# Patient Record
Sex: Female | Born: 1965 | Race: White | Hispanic: No | Marital: Single
Health system: Midwestern US, Academic
[De-identification: ages and names within clinical notes are randomized; demographics above are authoritative.]

---

## 2017-07-05 ENCOUNTER — Encounter: Admit: 2017-07-05 | Discharge: 2017-07-05 | Payer: 59

## 2017-07-05 DIAGNOSIS — R0789 Other chest pain: Principal | ICD-10-CM

## 2017-07-24 ENCOUNTER — Encounter: Admit: 2017-07-24 | Discharge: 2017-07-24 | Payer: 59

## 2018-03-19 ENCOUNTER — Encounter: Admit: 2018-03-19 | Discharge: 2018-03-19 | Payer: 59

## 2018-03-19 DIAGNOSIS — R079 Chest pain, unspecified: Principal | ICD-10-CM

## 2018-05-21 ENCOUNTER — Encounter: Admit: 2018-05-21 | Discharge: 2018-05-21 | Payer: 59

## 2018-05-21 ENCOUNTER — Ambulatory Visit: Admit: 2018-05-21 | Discharge: 2018-05-22 | Payer: 59

## 2018-05-21 ENCOUNTER — Ambulatory Visit: Admit: 2018-05-21 | Discharge: 2018-05-21 | Payer: 59

## 2018-05-21 DIAGNOSIS — R079 Chest pain, unspecified: Principal | ICD-10-CM

## 2018-05-21 DIAGNOSIS — E119 Type 2 diabetes mellitus without complications: ICD-10-CM

## 2018-05-21 DIAGNOSIS — E785 Hyperlipidemia, unspecified: ICD-10-CM

## 2018-05-21 DIAGNOSIS — G459 Transient cerebral ischemic attack, unspecified: ICD-10-CM

## 2018-05-21 DIAGNOSIS — R609 Edema, unspecified: ICD-10-CM

## 2018-05-21 DIAGNOSIS — R55 Syncope and collapse: ICD-10-CM

## 2018-05-21 DIAGNOSIS — I10 Essential (primary) hypertension: ICD-10-CM

## 2018-05-21 MED ORDER — REGADENOSON 0.4 MG/5 ML IV SYRG
.4 mg | Freq: Once | INTRAVENOUS | 0 refills | Status: CP
Start: 2018-05-21 — End: ?

## 2018-05-23 ENCOUNTER — Encounter: Admit: 2018-05-23 | Discharge: 2018-05-23 | Payer: 59

## 2018-05-23 DIAGNOSIS — R9439 Abnormal result of other cardiovascular function study: Principal | ICD-10-CM

## 2018-05-29 ENCOUNTER — Encounter: Admit: 2018-05-29 | Discharge: 2018-05-29 | Payer: 59

## 2018-05-29 DIAGNOSIS — R55 Syncope and collapse: ICD-10-CM

## 2018-05-29 DIAGNOSIS — R079 Chest pain, unspecified: Principal | ICD-10-CM

## 2018-05-29 DIAGNOSIS — R609 Edema, unspecified: ICD-10-CM

## 2018-05-29 DIAGNOSIS — E785 Hyperlipidemia, unspecified: ICD-10-CM

## 2018-05-29 DIAGNOSIS — E119 Type 2 diabetes mellitus without complications: ICD-10-CM

## 2018-05-29 DIAGNOSIS — I10 Essential (primary) hypertension: ICD-10-CM

## 2018-05-29 DIAGNOSIS — G459 Transient cerebral ischemic attack, unspecified: ICD-10-CM

## 2018-06-05 ENCOUNTER — Encounter: Admit: 2018-06-05 | Discharge: 2018-06-05 | Payer: 59

## 2018-06-05 ENCOUNTER — Ambulatory Visit: Admit: 2018-06-05 | Discharge: 2018-06-06 | Payer: 59

## 2018-06-05 DIAGNOSIS — I25118 Atherosclerotic heart disease of native coronary artery with other forms of angina pectoris: ICD-10-CM

## 2018-06-05 DIAGNOSIS — R079 Chest pain, unspecified: Principal | ICD-10-CM

## 2018-06-05 DIAGNOSIS — I10 Essential (primary) hypertension: ICD-10-CM

## 2018-06-05 DIAGNOSIS — E785 Hyperlipidemia, unspecified: ICD-10-CM

## 2018-06-05 DIAGNOSIS — R9439 Abnormal result of other cardiovascular function study: ICD-10-CM

## 2018-06-05 DIAGNOSIS — R609 Edema, unspecified: ICD-10-CM

## 2018-06-05 DIAGNOSIS — R55 Syncope and collapse: ICD-10-CM

## 2018-06-05 DIAGNOSIS — E119 Type 2 diabetes mellitus without complications: ICD-10-CM

## 2018-06-05 DIAGNOSIS — I25119 Atherosclerotic heart disease of native coronary artery with unspecified angina pectoris: ICD-10-CM

## 2018-06-05 DIAGNOSIS — G459 Transient cerebral ischemic attack, unspecified: ICD-10-CM

## 2018-06-05 LAB — BASIC METABOLIC PANEL
Lab: 137
Lab: 14
Lab: 15

## 2018-06-05 LAB — CBC
Lab: 13
Lab: 28 — ABNORMAL HIGH (ref 70–105)
Lab: 286
Lab: 32 — ABNORMAL LOW (ref 33.0–37.0)
Lab: 4.5
Lab: 8.1
Lab: 88

## 2018-06-05 MED ORDER — NITROGLYCERIN 0.4 MG SL SUBL
.4 mg | ORAL_TABLET | SUBLINGUAL | 0 refills | 9.00000 days | Status: AC | PRN
Start: 2018-06-05 — End: 2018-09-02

## 2018-06-05 MED ORDER — ROSUVASTATIN 20 MG PO TAB
20 mg | ORAL_TABLET | Freq: Every day | ORAL | 5 refills | 90.00000 days | Status: AC
Start: 2018-06-05 — End: 2019-01-13

## 2018-06-05 MED ORDER — ASPIRIN 81 MG PO TBEC
81 mg | ORAL_TABLET | Freq: Every day | ORAL | 5 refills | Status: AC
Start: 2018-06-05 — End: ?

## 2018-06-06 ENCOUNTER — Encounter: Admit: 2018-06-06 | Discharge: 2018-06-06 | Payer: 59

## 2018-06-06 DIAGNOSIS — R079 Chest pain, unspecified: Principal | ICD-10-CM

## 2018-06-06 DIAGNOSIS — R9439 Abnormal result of other cardiovascular function study: ICD-10-CM

## 2018-06-10 ENCOUNTER — Encounter: Admit: 2018-06-10 | Discharge: 2018-06-10 | Payer: 59

## 2018-06-10 DIAGNOSIS — R9439 Abnormal result of other cardiovascular function study: ICD-10-CM

## 2018-06-10 DIAGNOSIS — I208 Other forms of angina pectoris: Principal | ICD-10-CM

## 2018-06-10 MED ORDER — DOCUSATE SODIUM 100 MG PO CAP
100 mg | Freq: Every day | ORAL | 0 refills | Status: CN | PRN
Start: 2018-06-10 — End: ?

## 2018-06-10 MED ORDER — SODIUM CHLORIDE 0.9 % IV SOLP
1000 mL | INTRAVENOUS | 0 refills | Status: CN
Start: 2018-06-10 — End: ?

## 2018-06-10 MED ORDER — ALUMINUM-MAGNESIUM HYDROXIDE 200-200 MG/5 ML PO SUSP
30 mL | ORAL | 0 refills | Status: CN | PRN
Start: 2018-06-10 — End: ?

## 2018-06-10 MED ORDER — SODIUM CHLORIDE 0.9 % IV SOLP
250 mL | INTRAVENOUS | 0 refills | Status: CN
Start: 2018-06-10 — End: ?

## 2018-06-10 MED ORDER — ACETAMINOPHEN 325 MG PO TAB
650 mg | ORAL | 0 refills | Status: CN | PRN
Start: 2018-06-10 — End: ?

## 2018-06-11 ENCOUNTER — Encounter: Admit: 2018-06-11 | Discharge: 2018-06-11 | Payer: 59

## 2018-06-12 ENCOUNTER — Encounter: Admit: 2018-06-12 | Discharge: 2018-06-12 | Payer: 59

## 2018-06-12 ENCOUNTER — Ambulatory Visit: Admit: 2018-06-12 | Discharge: 2018-06-12 | Payer: 59

## 2018-06-12 ENCOUNTER — Encounter: Admit: 2018-06-12 | Discharge: 2018-06-13 | Payer: 59

## 2018-06-12 DIAGNOSIS — E785 Hyperlipidemia, unspecified: Secondary | ICD-10-CM

## 2018-06-12 DIAGNOSIS — R609 Edema, unspecified: ICD-10-CM

## 2018-06-12 DIAGNOSIS — I25118 Atherosclerotic heart disease of native coronary artery with other forms of angina pectoris: ICD-10-CM

## 2018-06-12 DIAGNOSIS — I10 Essential (primary) hypertension: Secondary | ICD-10-CM

## 2018-06-12 DIAGNOSIS — G459 Transient cerebral ischemic attack, unspecified: ICD-10-CM

## 2018-06-12 DIAGNOSIS — R55 Syncope and collapse: ICD-10-CM

## 2018-06-12 DIAGNOSIS — E119 Type 2 diabetes mellitus without complications: ICD-10-CM

## 2018-06-12 DIAGNOSIS — R079 Chest pain, unspecified: Principal | ICD-10-CM

## 2018-06-12 DIAGNOSIS — E1139 Type 2 diabetes mellitus with other diabetic ophthalmic complication: ICD-10-CM

## 2018-06-12 LAB — POC GLUCOSE
Lab: 182 mg/dL — ABNORMAL HIGH (ref 70–100)
Lab: 127 mg/dL — ABNORMAL HIGH (ref 70–100)

## 2018-06-12 LAB — LIPID PROFILE
Lab: 101 mg/dL
Lab: 164 mg/dL — ABNORMAL HIGH (ref <100)
Lab: 251 mg/dL
Lab: 295 mg/dL — ABNORMAL HIGH (ref <200)
Lab: 44 mg/dL (ref >40)
Lab: 503 mg/dL — ABNORMAL HIGH (ref <150)

## 2018-06-12 MED ORDER — METOPROLOL SUCCINATE 50 MG PO TB24
50 mg | Freq: Every day | ORAL | 0 refills | Status: DC
Start: 2018-06-12 — End: 2018-06-13
  Administered 2018-06-13: 13:00:00 50 mg via ORAL

## 2018-06-12 MED ORDER — ROSUVASTATIN 20 MG PO TAB
20 mg | Freq: Every evening | ORAL | 0 refills | Status: DC
Start: 2018-06-12 — End: 2018-06-13
  Administered 2018-06-13: 03:00:00 20 mg via ORAL

## 2018-06-12 MED ORDER — INSULIN GLARGINE 100 UNIT/ML (3 ML) SC INJ PEN
68 [IU] | Freq: Every day | SUBCUTANEOUS | 0 refills | Status: DC
Start: 2018-06-12 — End: 2018-06-13
  Administered 2018-06-13: 13:00:00 68 [IU] via SUBCUTANEOUS

## 2018-06-12 MED ORDER — FUROSEMIDE 40 MG PO TAB
40 mg | Freq: Every day | ORAL | 0 refills | Status: DC
Start: 2018-06-12 — End: 2018-06-13
  Administered 2018-06-13: 13:00:00 40 mg via ORAL

## 2018-06-12 MED ORDER — INSULIN GLARGINE 100 UNIT/ML SC SOLN
28 [IU] | Freq: Every evening | SUBCUTANEOUS | 0 refills | Status: DC
Start: 2018-06-12 — End: 2018-06-12

## 2018-06-12 MED ORDER — ALUMINUM-MAGNESIUM HYDROXIDE 200-200 MG/5 ML PO SUSP
30 mL | ORAL | 0 refills | Status: DC | PRN
Start: 2018-06-12 — End: 2018-06-13

## 2018-06-12 MED ORDER — SODIUM CHLORIDE 0.9 % IV SOLP
1000 mL | INTRAVENOUS | 0 refills | Status: DC
Start: 2018-06-12 — End: 2018-06-13
  Administered 2018-06-12: 14:00:00 1000 mL via INTRAVENOUS

## 2018-06-12 MED ORDER — DOCUSATE SODIUM 100 MG PO CAP
100 mg | Freq: Every day | ORAL | 0 refills | Status: DC | PRN
Start: 2018-06-12 — End: 2018-06-13

## 2018-06-12 MED ORDER — TICAGRELOR 90 MG PO TAB
90 mg | Freq: Two times a day (BID) | ORAL | 0 refills | Status: DC
Start: 2018-06-12 — End: 2018-06-13
  Administered 2018-06-13 (×2): 90 mg via ORAL

## 2018-06-12 MED ORDER — INSULIN GLARGINE 100 UNIT/ML SC SOLN
20 [IU] | Freq: Every evening | SUBCUTANEOUS | 0 refills | Status: DC
Start: 2018-06-12 — End: 2018-06-12

## 2018-06-12 MED ORDER — TICAGRELOR 90 MG PO TAB
180 mg | Freq: Once | ORAL | 0 refills | Status: DC
Start: 2018-06-12 — End: 2018-06-13

## 2018-06-12 MED ORDER — CITALOPRAM 20 MG PO TAB
20 mg | Freq: Three times a day (TID) | ORAL | 0 refills | Status: DC
Start: 2018-06-12 — End: 2018-06-13
  Administered 2018-06-12 – 2018-06-13 (×2): 20 mg via ORAL

## 2018-06-12 MED ORDER — ASPIRIN 81 MG PO TBEC
81 mg | Freq: Every day | ORAL | 0 refills | Status: DC
Start: 2018-06-12 — End: 2018-06-13
  Administered 2018-06-13: 13:00:00 81 mg via ORAL

## 2018-06-12 MED ORDER — DIPHENHYDRAMINE HCL 25 MG PO CAP
25 mg | ORAL | 0 refills | Status: DC | PRN
Start: 2018-06-12 — End: 2018-06-13
  Administered 2018-06-13: 03:00:00 25 mg via ORAL

## 2018-06-12 MED ORDER — SODIUM CHLORIDE 0.9 % IV SOLP
250 mL | INTRAVENOUS | 0 refills | Status: CP
Start: 2018-06-12 — End: ?
  Administered 2018-06-12: 12:00:00 250 mL via INTRAVENOUS

## 2018-06-12 MED ORDER — ONDANSETRON HCL (PF) 4 MG/2 ML IJ SOLN
4 mg | INTRAVENOUS | 0 refills | Status: DC | PRN
Start: 2018-06-12 — End: 2018-06-13

## 2018-06-12 MED ORDER — DIPHENHYDRAMINE HCL 50 MG/ML IJ SOLN
25 mg | INTRAVENOUS | 0 refills | Status: DC | PRN
Start: 2018-06-12 — End: 2018-06-13

## 2018-06-12 MED ORDER — ALLOPURINOL 300 MG PO TAB
300 mg | Freq: Every day | ORAL | 0 refills | Status: DC
Start: 2018-06-12 — End: 2018-06-13
  Administered 2018-06-12 – 2018-06-13 (×2): 300 mg via ORAL

## 2018-06-12 MED ORDER — INSULIN ASPART 100 UNIT/ML SC FLEXPEN
0-12 [IU] | Freq: Before meals | SUBCUTANEOUS | 0 refills | Status: DC
Start: 2018-06-12 — End: 2018-06-13

## 2018-06-12 MED ORDER — FENTANYL CITRATE (PF) 50 MCG/ML IJ SOLN
25 ug | Freq: Once | INTRAVENOUS | 0 refills | Status: CP
Start: 2018-06-12 — End: ?
  Administered 2018-06-12: 17:00:00 25 ug via INTRAVENOUS

## 2018-06-12 MED ORDER — ACETAMINOPHEN 325 MG PO TAB
650 mg | ORAL | 0 refills | Status: DC | PRN
Start: 2018-06-12 — End: 2018-06-13
  Administered 2018-06-12 – 2018-06-13 (×2): 650 mg via ORAL

## 2018-06-12 MED ORDER — INSULIN GLARGINE 100 UNIT/ML (3 ML) SC INJ PEN
20 [IU] | Freq: Every evening | SUBCUTANEOUS | 0 refills | Status: DC
Start: 2018-06-12 — End: 2018-06-13

## 2018-06-13 DIAGNOSIS — R0789 Other chest pain: ICD-10-CM

## 2018-06-13 DIAGNOSIS — I208 Other forms of angina pectoris: ICD-10-CM

## 2018-06-13 DIAGNOSIS — R0609 Other forms of dyspnea: ICD-10-CM

## 2018-06-13 DIAGNOSIS — R9439 Abnormal result of other cardiovascular function study: ICD-10-CM

## 2018-06-13 DIAGNOSIS — I2510 Atherosclerotic heart disease of native coronary artery without angina pectoris: Principal | ICD-10-CM

## 2018-06-13 LAB — POC GLUCOSE: Lab: 190 mg/dL — ABNORMAL HIGH (ref 70–100)

## 2018-06-13 LAB — POC ACTIVATED CLOTTING TIME
Lab: 210 s
Lab: 234 s

## 2018-06-13 MED ORDER — TICAGRELOR 90 MG PO TAB
90 mg | ORAL_TABLET | Freq: Two times a day (BID) | ORAL | 3 refills | Status: AC
Start: 2018-06-13 — End: 2019-05-12

## 2018-06-13 MED ORDER — POTASSIUM CHLORIDE 20 MEQ PO TBTQ
40 meq | Freq: Once | ORAL | 0 refills | Status: CP
Start: 2018-06-13 — End: ?
  Administered 2018-06-13: 13:00:00 40 meq via ORAL

## 2018-06-13 MED ORDER — METFORMIN 750 MG PO TB24
750 mg | ORAL_TABLET | Freq: Two times a day (BID) | ORAL | 3 refills | Status: SS
Start: 2018-06-13 — End: 2019-06-12

## 2018-06-16 ENCOUNTER — Encounter: Admit: 2018-06-16 | Discharge: 2018-06-16 | Payer: 59

## 2018-06-18 ENCOUNTER — Encounter: Admit: 2018-06-18 | Discharge: 2018-06-18 | Payer: 59

## 2018-07-11 ENCOUNTER — Encounter: Admit: 2018-07-11 | Discharge: 2018-07-11 | Payer: 59

## 2018-07-25 ENCOUNTER — Encounter: Admit: 2018-07-25 | Discharge: 2018-07-25 | Payer: 59

## 2018-08-31 ENCOUNTER — Encounter: Admit: 2018-08-31 | Discharge: 2018-08-31 | Payer: 59

## 2018-09-02 MED ORDER — NITROGLYCERIN 0.4 MG SL SUBL
ORAL_TABLET | SUBLINGUAL | 0 refills | 9.00000 days | Status: AC | PRN
Start: 2018-09-02 — End: ?

## 2018-10-15 ENCOUNTER — Encounter: Admit: 2018-10-15 | Discharge: 2018-10-15 | Payer: 59

## 2018-10-22 LAB — COMPREHENSIVE METABOLIC PANEL
Lab: 0.5
Lab: 107
Lab: 140
Lab: 34 (ref 0–55)
Lab: 4.1
Lab: 43 — AB (ref 5–34)
Lab: 7.9

## 2018-10-22 LAB — LIPID PROFILE
Lab: 205 — AB (ref 150–200)
Lab: 44 (ref 35–60)
Lab: 5
Lab: 98

## 2018-10-22 LAB — THYROID STIMULATING HORMONE-TSH: Lab: 1 — AB (ref 30–200)

## 2018-10-23 ENCOUNTER — Encounter: Admit: 2018-10-23 | Discharge: 2018-10-23 | Payer: 59

## 2018-10-30 ENCOUNTER — Encounter: Admit: 2018-10-30 | Discharge: 2018-10-30 | Payer: 59

## 2018-10-30 ENCOUNTER — Ambulatory Visit: Admit: 2018-10-30 | Discharge: 2018-10-31 | Payer: 59

## 2018-10-30 DIAGNOSIS — E785 Hyperlipidemia, unspecified: Secondary | ICD-10-CM

## 2018-10-30 DIAGNOSIS — E119 Type 2 diabetes mellitus without complications: ICD-10-CM

## 2018-10-30 DIAGNOSIS — I10 Essential (primary) hypertension: Secondary | ICD-10-CM

## 2018-10-30 DIAGNOSIS — I25118 Atherosclerotic heart disease of native coronary artery with other forms of angina pectoris: ICD-10-CM

## 2018-10-30 DIAGNOSIS — G459 Transient cerebral ischemic attack, unspecified: ICD-10-CM

## 2018-10-30 DIAGNOSIS — E1139 Type 2 diabetes mellitus with other diabetic ophthalmic complication: ICD-10-CM

## 2018-10-30 DIAGNOSIS — R079 Chest pain, unspecified: Principal | ICD-10-CM

## 2018-10-30 DIAGNOSIS — R55 Syncope and collapse: ICD-10-CM

## 2018-10-30 DIAGNOSIS — I2510 Atherosclerotic heart disease of native coronary artery without angina pectoris: Principal | ICD-10-CM

## 2018-10-30 DIAGNOSIS — R609 Edema, unspecified: ICD-10-CM

## 2018-12-02 ENCOUNTER — Encounter: Admit: 2018-12-02 | Discharge: 2018-12-02 | Payer: 59

## 2019-01-13 ENCOUNTER — Encounter: Admit: 2019-01-13 | Discharge: 2019-01-13 | Payer: 59

## 2019-01-13 MED ORDER — ROSUVASTATIN 20 MG PO TAB
ORAL_TABLET | Freq: Every day | ORAL | 1 refills | 90.00000 days | Status: AC
Start: 2019-01-13 — End: 2019-02-12

## 2019-02-12 ENCOUNTER — Encounter: Admit: 2019-02-12 | Discharge: 2019-02-12 | Payer: 59

## 2019-02-12 ENCOUNTER — Ambulatory Visit: Admit: 2019-02-12 | Discharge: 2019-02-12 | Payer: 59

## 2019-02-12 DIAGNOSIS — I10 Essential (primary) hypertension: ICD-10-CM

## 2019-02-12 DIAGNOSIS — G459 Transient cerebral ischemic attack, unspecified: ICD-10-CM

## 2019-02-12 DIAGNOSIS — R609 Edema, unspecified: ICD-10-CM

## 2019-02-12 DIAGNOSIS — R55 Syncope and collapse: ICD-10-CM

## 2019-02-12 DIAGNOSIS — E7800 Pure hypercholesterolemia, unspecified: ICD-10-CM

## 2019-02-12 DIAGNOSIS — I2510 Atherosclerotic heart disease of native coronary artery without angina pectoris: Principal | ICD-10-CM

## 2019-02-12 DIAGNOSIS — R079 Chest pain, unspecified: Principal | ICD-10-CM

## 2019-02-12 DIAGNOSIS — I25118 Atherosclerotic heart disease of native coronary artery with other forms of angina pectoris: ICD-10-CM

## 2019-02-12 DIAGNOSIS — E785 Hyperlipidemia, unspecified: ICD-10-CM

## 2019-02-12 DIAGNOSIS — E119 Type 2 diabetes mellitus without complications: ICD-10-CM

## 2019-02-12 DIAGNOSIS — E1139 Type 2 diabetes mellitus with other diabetic ophthalmic complication: ICD-10-CM

## 2019-02-12 MED ORDER — ROSUVASTATIN 40 MG PO TAB
40 mg | ORAL_TABLET | Freq: Every day | ORAL | 3 refills | 90.00000 days | Status: AC
Start: 2019-02-12 — End: ?

## 2019-02-12 NOTE — Progress Notes
underwent coronary angiography at another facility on June 19, 2011 without finding significant disease requiring intervention.    Historically, Traci Wade was seen on June 05, 2018 for exertional chest discomfort suggestive for angina.  She had also recently had undergone stress testing was abnormal.  She was referred for coronary angiography which was performed on 06/12/2018.  This revealed a high-grade stenosis in mid left anterior descending along with an occluded small diagonal branch.  She underwent stenting of the left anterior descending coronary artery with a drug-eluting stent with excellent results.        Vitals:    02/12/19 0919 02/12/19 0938   BP: 100/80 110/82   BP Source: Arm, Left Upper Arm, Right Upper   Pulse: 70    SpO2: 98%    Weight: 84.5 kg (186 lb 3.2 oz)    Height: 1.676 m (5' 6)    PainSc: Zero      Body mass index is 30.05 kg/m???.     Past Medical History  Patient Active Problem List    Diagnosis Date Noted   ??? Coronary artery disease of native artery of native heart with stable angina pectoris [facility]) 06/12/2018     06/12/2018 - cath with high grade stenosis in the mid left anterior descending artery status post percutaneous coronary intervention with drug-eluting stent x1.  Initiated on aspirin and Brilinta     ??? Type 2 diabetes mellitus with ophthalmic complication (HCC) 06/12/2018   ??? Dyslipidemia, goal LDL below 70 06/12/2018     Lab Results   Component Value Date    CHOL 295 06/12/2018    TRIG 503 06/12/2018    HDL 44 06/12/2018    LDL 164 06/12/2018    VLDL 101 06/12/2018   Started on Crestor 20 mg daily      ??? Coronary artery disease 06/12/2018   ??? Chest pain 05/29/2018     05/21/2018 MPI  SUMMARY/OPINION:??????1. Mildly abnormal study.  Small reversible/ischemic apical anteroseptal defect (1-2 segments).  With associated mild post-rest hypokinesis. Preserved apical and remaining myocardium uptake.  2. Low risk features for annual cardiovascular 3. Normal left ventricular diastolic function.   4. Right ventricular contractility appears normal.  5. Normal chamber dimensions.  6. There is no evidence of significant valvular regurgitation or stenosis by doppler exam.  7. No pericardial effusion is seen.  ???  Regadenoson thallium stress test 05/21/2018:  Pharmacological Stress Electrocardiogram: Post-infusion ECG: Nonischemic response to regadenoson infusion.???  Scintigraphic (planar/tomographic):???Small reversible/ischemic apical anteroseptal defect (1-2 segments). ???With associated mild post-rest hypokinesis. Preserved apical and remaining myocardium uptake. Summed Stress Score: ???2, Summed Rest Score: ???0.???Regional Wall Thickening and Motion Post Stress: ???Mild hypokinesis and decreased thickening of the apical anteroseptal, otherwise normal regional wall motion.???Left Ventricular Ejection Fraction (post stress, in the resting state) =??????60 %. Left Ventricular End Diastolic Volume: 65 mL  SUMMARY/OPINION:??????  1. Mildly abnormal study. ???Small reversible/ischemic apical anteroseptal defect (1-2 segments). ???With associated mild post-rest hypokinesis. Preserved apical and remaining myocardium uptake.  2. Low risk features for annual cardiovascular mortality rate include: Summed stress score of 2, no transient ischemic dilatation, normal lung to heart ratio, normal ejection fraction of 60%.  3. Nonischemic ECG response to infusion.  ???  Coronary angiography/intervention 06/12/2018:  SELECTIVE CORONARY ANGIOGRAPHY:??????LEFT MAIN CORONARY ARTERY: ???The left main coronary artery is a medium caliber vessel which distally bifurcates into LAD and circumflex, and is free of angiographically significant disease. LAD: ???The LAD is a medium caliber vessel, which gives off 1  diagonal branch. ???The diagonal branch is occluded proximally. ???The LAD has a focal area of 99% stenosis in its midportion. ???This is bordered by 60% to 70% ??? insulin glargine (LANTUS) 100 unit/mL injection Inject 68 Units under the skin daily. 1 vial contains 10mL   Patient injects 68 units in the morning and 38 units at bedtime   ??? insulin glargine (LANTUS) 100 unit/mL injection Inject 30 Units under the skin at bedtime daily. 1 vial contains 10mL    ??? insulin regular(DIL) (HUMULIN R) 10 units/mL Inject  under the skin three times daily before meals. Take as directed    ??? ketoconazole (NIZORAL) 2 % topical cream Apply  topically to affected area as Needed.   ??? metFORMIN-XR(+) (GLUCOPHAGE XR) 750 mg extended release tablet Take one tablet by mouth twice daily with meals. Ok to resume on June 15, 2018   ??? metoprolol XL (TOPROL XL) 25 mg extended release tablet Take 25 mg by mouth daily.   ??? nitroglycerin (NITROSTAT) 0.4 mg tablet DISSOLVE ONE TABLET UNDER THE TONGUE EVERY 5 MINUTES AS NEEDED FOR CHEST PAIN.  DO NOT EXCEED A TOTAL OF 3 DOSES IN 15 MINUTES   ??? PROAIR HFA 90 mcg/actuation inhaler Inhale 2 puffs by mouth into the lungs as Needed.   ??? rosuvastatin (CRESTOR) 20 mg tablet TAKE 1 TABLET BY MOUTH ONCE DAILY   ??? SKYRIZI 75 mg/0.83 mL syringe Inject 150 mg under the skin every 90 days.   ??? ticagrelor (BRILINTA) 90 mg Take one tablet by mouth twice daily.

## 2019-05-12 ENCOUNTER — Encounter: Admit: 2019-05-12 | Discharge: 2019-05-12 | Payer: 59

## 2019-05-12 DIAGNOSIS — I25118 Atherosclerotic heart disease of native coronary artery with other forms of angina pectoris: Principal | ICD-10-CM

## 2019-05-12 MED ORDER — BRILINTA 90 MG PO TAB
ORAL_TABLET | Freq: Two times a day (BID) | 0 refills
Start: 2019-05-12 — End: ?

## 2019-05-12 NOTE — Telephone Encounter
-----   Message from Hester Mates, MD sent at 05/12/2019 10:15 AM CDT -----  Regarding: RE: Darlyn Read: It has been over 6 months since she underwent coronary stenting for stable ischemic coronary disease.  She should stop Brilinta.  She has the option of starting clopidogrel 75 mg daily if she would like additional antiplatelet coverage.  This is optional.  It carries a lower ischemic risk but a higher bleeding risk.  Please let her know.  Schedule her for a clinic visit so that I can discuss this in greater detail with her.  Thanks.  SBG  ----- Message -----  From: Weston Brass  Sent: 05/12/2019   9:57 AM CDT  To: Hester Mates, MD  Subject: birllenta                                        Received refill request from [facility].  Refill or DC?    Thanks  Brett Canales

## 2019-05-12 NOTE — Telephone Encounter
-----   Message from Hester Mates, MD sent at 05/12/2019 10:15 AM CDT -----  Regarding: RE: Traci Wade: It has been over 6 months since she underwent coronary stenting for stable ischemic coronary disease.  She should stop Brilinta.  She has the option of starting clopidogrel 75 mg daily if she would like additional antiplatelet coverage.  This is optional.  It carries a lower ischemic risk but a higher bleeding risk.  Please let her know.  Schedule her for a clinic visit so that I can discuss this in greater detail with her.  Thanks.  SBG  ----- Message -----  From: Weston Brass  Sent: 05/12/2019   9:57 AM CDT  To: Hester Mates, MD  Subject: birllenta                                        Received refill request from [facility].  Refill or DC?    Thanks  Brett Canales

## 2019-06-08 ENCOUNTER — Encounter: Admit: 2019-06-08 | Discharge: 2019-06-08

## 2019-06-08 ENCOUNTER — Encounter: Admit: 2019-06-08 | Discharge: 2019-06-09

## 2019-06-08 DIAGNOSIS — R69 Illness, unspecified: Secondary | ICD-10-CM

## 2019-06-09 ENCOUNTER — Encounter: Admit: 2019-06-09 | Discharge: 2019-06-09

## 2019-06-09 ENCOUNTER — Encounter
Admit: 2019-06-09 | Discharge: 2019-06-12 | Disposition: A | Discharge status: Home / Self Care | Source: Other Acute Inpatient Hospital

## 2019-06-09 DIAGNOSIS — E785 Hyperlipidemia, unspecified: Secondary | ICD-10-CM

## 2019-06-09 DIAGNOSIS — E1165 Type 2 diabetes mellitus with hyperglycemia: Secondary | ICD-10-CM

## 2019-06-09 DIAGNOSIS — E781 Pure hyperglyceridemia: Principal | ICD-10-CM

## 2019-06-09 LAB — CBC: Lab: 8.4 10*3/uL (ref 4.5–11.0)

## 2019-06-09 LAB — COMPREHENSIVE METABOLIC PANEL
Lab: 107 U/L (ref 25–110)
Lab: 136 MMOL/L — ABNORMAL LOW (ref 137–147)
Lab: 14 — ABNORMAL HIGH (ref 3–12)
Lab: 21 MMOL/L (ref 21–30)
Lab: 3.7 g/dL (ref 3.5–5.0)
Lab: 34 U/L (ref 7–56)
Lab: 47 U/L — ABNORMAL HIGH (ref 7–40)
Lab: >60 mL/min (ref >60)
Lab: >60 mL/min (ref >60)

## 2019-06-09 LAB — POC GLUCOSE
Lab: 430 mg/dL — ABNORMAL HIGH (ref 70–100)
Lab: 330 mg/dL — ABNORMAL HIGH (ref 70–100)

## 2019-06-09 LAB — LIPID PROFILE
Lab: 111 mg/dL (ref 6.0–8.0)
Lab: 118 mg/dL — ABNORMAL HIGH (ref <100)
Lab: 579 mg/dL (ref 0.3–1.2)
Lab: 609 mg/dL — ABNORMAL HIGH (ref <200)

## 2019-06-09 LAB — LIPASE: Lab: 59 U/L — ABNORMAL LOW (ref 11–82)

## 2019-06-09 LAB — BNP (B-TYPE NATRIURETIC PEPTI): Lab: 11 pg/mL — ABNORMAL LOW (ref >40)

## 2019-06-09 LAB — TROPONIN-I: Lab: 0 ng/mL — ABNORMAL HIGH (ref <150)

## 2019-06-09 MED ORDER — FENOFIBRATE NANOCRYSTALLIZED 145 MG PO TAB
145 mg | Freq: Every day | ORAL | 0 refills | Status: DC
Start: 2019-06-09 — End: 2019-06-12
  Administered 2019-06-10 – 2019-06-12 (×4): 145 mg via ORAL

## 2019-06-09 MED ORDER — ONDANSETRON HCL (PF) 4 MG/2 ML IJ SOLN
4 mg | INTRAVENOUS | 0 refills | Status: DC | PRN
Start: 2019-06-09 — End: 2019-06-12
  Administered 2019-06-11: 18:00:00 4 mg via INTRAVENOUS

## 2019-06-09 MED ORDER — INSULIN GLARGINE 100 UNIT/ML (3 ML) SC INJ PEN
35 [IU] | Freq: Every evening | SUBCUTANEOUS | 0 refills | Status: DC
Start: 2019-06-09 — End: 2019-06-10
  Administered 2019-06-10: 02:00:00 35 [IU] via SUBCUTANEOUS

## 2019-06-09 MED ORDER — POLYETHYLENE GLYCOL 3350 17 GRAM PO PWPK
1 | Freq: Every day | ORAL | 0 refills | Status: DC | PRN
Start: 2019-06-09 — End: 2019-06-12

## 2019-06-09 MED ORDER — INSULIN GLARGINE 100 UNIT/ML SC SOLN
35 [IU] | Freq: Every evening | SUBCUTANEOUS | 0 refills | Status: DC
Start: 2019-06-09 — End: 2019-06-09

## 2019-06-09 MED ORDER — BISACODYL 10 MG RE SUPP
10 mg | Freq: Every day | RECTAL | 0 refills | Status: DC | PRN
Start: 2019-06-09 — End: 2019-06-12

## 2019-06-09 MED ORDER — OMEGA 3-DHA-EPA-FISH OIL 300-1,000 MG PO CPDR
1000 mg | Freq: Two times a day (BID) | ORAL | 0 refills | Status: DC
Start: 2019-06-09 — End: 2019-06-11
  Administered 2019-06-10 – 2019-06-11 (×4): 1000 mg via ORAL

## 2019-06-09 MED ORDER — SIMVASTATIN 40 MG PO TAB
40 mg | Freq: Every evening | ORAL | 0 refills | Status: DC
Start: 2019-06-09 — End: 2019-06-10
  Administered 2019-06-10: 02:00:00 40 mg via ORAL

## 2019-06-09 MED ORDER — ASPIRIN 81 MG PO TBEC
81 mg | Freq: Every day | ORAL | 0 refills | Status: DC
Start: 2019-06-09 — End: 2019-06-12
  Administered 2019-06-09 – 2019-06-12 (×4): 81 mg via ORAL

## 2019-06-09 MED ORDER — INSULIN GLARGINE 100 UNIT/ML (3 ML) SC INJ PEN
65 [IU] | Freq: Every day | SUBCUTANEOUS | 0 refills | Status: DC
Start: 2019-06-09 — End: 2019-06-10
  Administered 2019-06-10: 14:00:00 65 [IU] via SUBCUTANEOUS

## 2019-06-09 MED ORDER — PNEUMOCOCCAL 23-VAL PS VACCINE 25 MCG/0.5 ML IJ SOLN
.5 mL | Freq: Once | INTRAMUSCULAR | 0 refills | Status: CP
Start: 2019-06-09 — End: ?
  Administered 2019-06-11: 16:00:00 0.5 mL via INTRAMUSCULAR

## 2019-06-09 MED ORDER — INSULIN GLARGINE 100 UNIT/ML SC SOLN
65 [IU] | Freq: Every morning | SUBCUTANEOUS | 0 refills | Status: DC
Start: 2019-06-09 — End: 2019-06-09

## 2019-06-09 MED ORDER — INSULIN ASPART 100 UNIT/ML SC FLEXPEN
0-6 [IU] | Freq: Before meals | SUBCUTANEOUS | 0 refills | Status: DC
Start: 2019-06-09 — End: 2019-06-10
  Administered 2019-06-09: 22:00:00 4 [IU] via SUBCUTANEOUS
  Administered 2019-06-10: 02:00:00 2 [IU] via SUBCUTANEOUS
  Administered 2019-06-10: 15:00:00 3 [IU] via SUBCUTANEOUS

## 2019-06-09 MED ORDER — ONDANSETRON HCL 4 MG PO TAB
4 mg | ORAL | 0 refills | Status: DC | PRN
Start: 2019-06-09 — End: 2019-06-12
  Administered 2019-06-10: 4 mg via ORAL

## 2019-06-09 MED ORDER — IOHEXOL 350 MG IODINE/ML IV SOLN
100 mL | Freq: Once | INTRAVENOUS | 0 refills | Status: CP
Start: 2019-06-09 — End: ?
  Administered 2019-06-10: 02:00:00 100 mL via INTRAVENOUS

## 2019-06-09 MED ORDER — CITALOPRAM 20 MG PO TAB
20 mg | Freq: Every day | ORAL | 0 refills | Status: DC
Start: 2019-06-09 — End: 2019-06-12
  Administered 2019-06-09 – 2019-06-12 (×4): 20 mg via ORAL

## 2019-06-09 MED ORDER — SODIUM CHLORIDE 0.9 % IJ SOLN
50 mL | Freq: Once | INTRAVENOUS | 0 refills | Status: CP
Start: 2019-06-09 — End: ?
  Administered 2019-06-10: 02:00:00 50 mL via INTRAVENOUS

## 2019-06-09 MED ORDER — METOPROLOL SUCCINATE 25 MG PO TB24
25 mg | Freq: Every day | ORAL | 0 refills | Status: DC
Start: 2019-06-09 — End: 2019-06-12
  Administered 2019-06-09 – 2019-06-12 (×4): 25 mg via ORAL

## 2019-06-09 MED ORDER — INSULIN GLARGINE 100 UNIT/ML (3 ML) SC INJ PEN
65 [IU] | Freq: Every day | SUBCUTANEOUS | 0 refills | Status: DC
Start: 2019-06-09 — End: 2019-06-09

## 2019-06-09 MED ORDER — MELATONIN 3 MG PO TAB
3 mg | Freq: Every evening | ORAL | 0 refills | Status: DC | PRN
Start: 2019-06-09 — End: 2019-06-12
  Administered 2019-06-11 – 2019-06-12 (×2): 3 mg via ORAL

## 2019-06-09 MED ORDER — KETOROLAC 30 MG/ML (1 ML) IJ SOLN
30 mg | Freq: Once | INTRAVENOUS | 0 refills | Status: DC
Start: 2019-06-09 — End: 2019-06-10

## 2019-06-09 MED ORDER — ACETAMINOPHEN 325 MG PO TAB
650 mg | ORAL | 0 refills | Status: DC | PRN
Start: 2019-06-09 — End: 2019-06-12
  Administered 2019-06-10 – 2019-06-12 (×8): 650 mg via ORAL

## 2019-06-09 NOTE — Progress Notes
Patient arrived to room # [phone]) via wheelchair accompanied by RN. Patient transferred to the bed without assistance. Bedside safety checks completed. Initial patient assessment completed. Refer to flowsheet for details.    Admission skin assessment completed with: Cortney, RN    Pressure injury present on arrival?: No    1. Head/Face/Neck: No  2. Trunk/Back: No  3. Upper Extremities: No  4. Lower Extremities: No  5. Pelvic/Coccyx: No  6. Assessed for device associated injury? Yes  7. Malnutrition Screening Tool (Nursing Nutrition Assessment) Completed? Yes    See Doc Flowsheet for additional wound details.     INTERVENTIONS:

## 2019-06-09 NOTE — H&P (View-Only)
Name:  Traci Wade                                             MRN:  [id number]   Admission Date:  06/09/2019                     Assessment/Plan:      Recent covid pneumonia  - hospitalized 3 weeks ago at OSH  - CTA chest done at pcp office yesterday showed improvement of pneumonia  - she still experiencing exertional SOB, cough  - will retest her for coronavirus  - in the meantime isolation    Hyperlipidemia/hyper TG  - hx of HLD, on statin PTA  - lipid panel ordered  - lipase ordered  - CT abd ordered    HTN/CAD  - sp DES here at [facility] 1 year ago 05/2018  - she reports finished taking brilinta a few days ago  - will continue PTA asa, metoprolol, statin  - suspect her SOB and chest discomfort with breathing all related to covid pneumonia  - bnp, 12 lead ECG and troponin pending  - chest xray pending    DM 2  - continue PTA lantus 65 QAM and 35 QPM  - low dose correction insulin  - hold PTA metformi  - HgA1c pending  - DM diet    Records from her pcp office especially CTA chest requested.    Lipid panel resulted showing TG of 5558, resumed PTA rosuvastatin, added fenofibrate, fish oil    ______________________________________________________________________________    Primary Care Physician: Huntington, Melissa     Chief Complaint: elevated Tg    History of Present Illness: Traci Wade is a 54 y.o. female with PMH of as below recently in may almost 3 weeks ago was hospitalized at OSH for covid pneumonia, presented directly from her cp office where she presented initially as a follow up after hospital discharge. Patient reports that she still has exertional sob and cough, but better compared when she was hospitalized for covid in may. Her pcp did CTA chest yesterday which ruled out PE and showed improvement of pneumonia (This is all as per transferring nurse note, because patient didn't come with any records), patient was also found to have enlarged liver and elevated lipids. Patient denies any fever and chills, no nausea, no vomiting, she has hx of hld for which she takes statin, she reports epigastric and RUQ pain with pressing but none otherwise, she cant tell me for how long she has been having abd discomfort. Patient didn't have any liver imaging, lipase etc at her pcp office.       Medical History:   Diagnosis Date   ??? Chest pain    ??? Coronary artery disease of native artery of native heart with stable angina pectoris [facility]) 06/12/2018    06/12/2018 - cath with high grade stenosis in the mid left anterior descending artery status post percutaneous coronary intervention with drug-eluting stent x1.  Initiated on aspirin and Brilinta   ??? Diabetes (HCC)    ??? Dyslipidemia, goal LDL below 70 06/12/2018   ??? Edema    ??? HTN (hypertension)    ??? Hyperlipemia    ??? Hypertension 05/29/2018   ??? Syncope and collapse    ??? TIA (transient ischemic attack)    ??? Type 2 diabetes mellitus with ophthalmic complication (  HCC) 06/12/2018     Surgical History:   Procedure Laterality Date   ??? ANGIOGRAPHY CORONARY ARTERY WITH LEFT HEART CATHETERIZATION N/A 06/12/2018    Performed by Nat Math, MD at [facility] CATH LAB   ??? POSSIBLE PERCUTANEOUS CORONARY STENT PLACEMENT WITH ANGIOPLASTY N/A 06/12/2018    Performed by Nat Math, MD at [facility] CATH LAB     Family History   Problem Relation Age of Onset   ??? Cancer Mother    ??? Heart Attack Father    ??? Cancer Sister    ??? Premature Heart Disease Sister    ??? Stroke Maternal Grandmother      Social History     Socioeconomic History   ??? Marital status: Divorced     Spouse name: Not on file   ??? Number of children: Not on file   ??? Years of education: Not on file   ??? Highest education level: Not on file   Occupational History   ??? Not on file   Tobacco Use   ??? Smoking status: Former Smoker     Types: Cigarettes   ??? Smokeless tobacco: Never Used   Substance and Sexual Activity   ??? Alcohol use: Not Currently   ??? Drug use: Never   ??? Sexual activity: Not on file   Other Topics Concern ??? Not on file   Social History Narrative   ??? Not on file      Immunizations (includes history and patient reported):   There is no immunization history on file for this patient.        Allergies:  Morphine; Celebrex [celecoxib]; Latex; Penicillins; and Sulfa (sulfonamide antibiotics)    Medications:  Medications Prior to Admission   Medication Sig   ??? albuterol 0.083% (PROVENTIL) 2.5 mg /3 mL (0.083 %) nebulizer solution Inhale  solution by nebulizer as directed as Needed.   ??? allopurinol (ZYLOPRIM) 300 mg tablet Take 300 mg by mouth as Needed. Take with food.    ??? aspirin EC 81 mg tablet Take one tablet by mouth daily. Take with food.   ??? cholecalciferol(+) (Vitamin D3) (OPTIMAL D3) 50,000 units capsule Take 50,000 Units by mouth every 7 days.   ??? citalopram (CELEXA) 20 mg tablet Take 20 mg by mouth daily.   ??? furosemide (LASIX) 20 mg tablet Take 20 mg by mouth twice daily. Patient takes 40mg  in the morning and 20mg  in the afternoon   ??? insulin glargine (LANTUS) 100 unit/mL injection Inject 68 Units under the skin daily. 1 vial contains 10mL   Patient injects 68 units in the morning and 38 units at bedtime   ??? insulin glargine (LANTUS) 100 unit/mL injection Inject 30 Units under the skin at bedtime daily. 1 vial contains 10mL    ??? insulin regular(DIL) (HUMULIN R) 10 units/mL Inject  under the skin three times daily before meals. Take as directed    ??? ketoconazole (NIZORAL) 2 % topical cream Apply  topically to affected area as Needed.   ??? metFORMIN-XR(+) (GLUCOPHAGE XR) 750 mg extended release tablet Take one tablet by mouth twice daily with meals. Ok to resume on June 15, 2018   ??? metoprolol XL (TOPROL XL) 25 mg extended release tablet Take 25 mg by mouth daily.   ??? nitroglycerin (NITROSTAT) 0.4 mg tablet DISSOLVE ONE TABLET UNDER THE TONGUE EVERY 5 MINUTES AS NEEDED FOR CHEST PAIN.  DO NOT EXCEED A TOTAL OF 3 DOSES IN 15 MINUTES   ??? PROAIR HFA 90 mcg/actuation  inhaler Inhale 2 puffs by mouth into the lungs as Needed.   ??? rosuvastatin (CRESTOR) 40 mg tablet Take one tablet by mouth daily.   ??? SKYRIZI 75 mg/0.83 mL syringe Inject 150 mg under the skin every 90 days.     Review of Systems:  A comprehensive  12 point review of organ systems reviewed and was negative except for the ones mentioned in HOPI    Physical Exam:  Vital Signs: Last Filed In 24 Hours Vital Signs: 24 Hour Range   BP: 140/87 (06/09 1441)  Temp: 37.1 ???C (98.8 ???F) (06/09 1441)  Pulse: 97 (06/09 1441)  Respirations: 18 PER MINUTE (06/09 1441)  SpO2: 96 % (06/09 1441)  Height: 165.1 cm (65) (06/09 1441) BP: (140)/(87)   Temp:  [37.1 ???C (98.8 ???F)]   Pulse:  [97]   Respirations:  [18 PER MINUTE]   SpO2:  [96 %]           General:  Alert, awake, oriented x 3 , cooperative, no distress, appears stated age  Head:  Normocephalic, without obvious abnormality, atraumatic  Eyes:  Conjunctivae/corneas clear  Nose: Nares normal. Mucosa normal.  No drainage or sinus tenderness  Throat: Lips, mucosa and tongue normal  Neck:    Supple, symmetrical, trachea midline, no adenopathy, thyroid: no enlargement/tenderness/nodules  Lungs:  Clear to auscultation bilaterally  Heart:   Regular rate and rhythm, S1, S2 normal, no murmur  Abdomen:  Soft, epigastric and RUQ tenderness.  Bowel sounds normal.  No masses.  No organomegaly.  Extremities: Extremities normal, atraumatic, no cyanosis or edema  Skin: Skin color, texture, turgor normal.    Lymph nodes:  Cervical, supraclavicular and axillary nodes normal  Neurologic: Non focal grossly    Lab/Radiology/Other Diagnostic Tests:  24-hour labs:    Results for orders placed or performed during the hospital encounter of 06/09/19 (from the past 24 hour(s))   POC GLUCOSE    Collection Time: 06/09/19  2:46 PM   Result Value Ref Range    Glucose, POC 430 (H) 70 - 100 MG/DL     POC Glucose (Download): (!) 430 (06/09/19 1446)  Pertinent radiology reviewed.    No results found.

## 2019-06-10 ENCOUNTER — Encounter: Admit: 2019-06-10 | Discharge: 2019-06-10

## 2019-06-10 DIAGNOSIS — E1139 Type 2 diabetes mellitus with other diabetic ophthalmic complication: Secondary | ICD-10-CM

## 2019-06-10 DIAGNOSIS — G459 Transient cerebral ischemic attack, unspecified: Secondary | ICD-10-CM

## 2019-06-10 DIAGNOSIS — R55 Syncope and collapse: Secondary | ICD-10-CM

## 2019-06-10 DIAGNOSIS — R609 Edema, unspecified: Secondary | ICD-10-CM

## 2019-06-10 DIAGNOSIS — I10 Essential (primary) hypertension: Secondary | ICD-10-CM

## 2019-06-10 DIAGNOSIS — E785 Hyperlipidemia, unspecified: Secondary | ICD-10-CM

## 2019-06-10 DIAGNOSIS — E119 Type 2 diabetes mellitus without complications: Secondary | ICD-10-CM

## 2019-06-10 DIAGNOSIS — I25118 Atherosclerotic heart disease of native coronary artery with other forms of angina pectoris: Secondary | ICD-10-CM

## 2019-06-10 DIAGNOSIS — R079 Chest pain, unspecified: Secondary | ICD-10-CM

## 2019-06-10 LAB — POC GLUCOSE
Lab: 280 mg/dL — ABNORMAL HIGH (ref 70–100)
Lab: 297 mg/dL — ABNORMAL HIGH (ref 70–100)
Lab: 331 mg/dL — ABNORMAL HIGH (ref 70–100)
Lab: 286 mg/dL — ABNORMAL HIGH (ref 70–100)

## 2019-06-10 LAB — COMPREHENSIVE METABOLIC PANEL
Lab: 133 MMOL/L — ABNORMAL LOW (ref >60)
Lab: 240 mg/dL — ABNORMAL HIGH (ref 70–100)
Lab: 3.8 MMOL/L (ref >60)
Lab: 98 MMOL/L (ref 98–110)

## 2019-06-10 LAB — COVID-19 (SARS-COV-2) PCR

## 2019-06-10 LAB — CBC
Lab: 10 g/dL — ABNORMAL LOW (ref >60)
Lab: 3.8 M/UL — ABNORMAL LOW (ref >60)
Lab: 7.8 K/UL — ABNORMAL LOW (ref <100)

## 2019-06-10 LAB — HEMOGLOBIN A1C: Lab: 9.4 % — ABNORMAL HIGH (ref 4.0–6.0)

## 2019-06-10 MED ORDER — INSULIN ASPART 100 UNIT/ML SC FLEXPEN
0-12 [IU] | Freq: Before meals | SUBCUTANEOUS | 0 refills | Status: DC
Start: 2019-06-10 — End: 2019-06-12

## 2019-06-10 MED ORDER — INSULIN GLARGINE 100 UNIT/ML (3 ML) SC INJ PEN
40 [IU] | Freq: Every evening | SUBCUTANEOUS | 0 refills | Status: DC
Start: 2019-06-10 — End: 2019-06-12

## 2019-06-10 MED ORDER — ROSUVASTATIN 20 MG PO TAB
40 mg | Freq: Every evening | ORAL | 0 refills | Status: DC
Start: 2019-06-10 — End: 2019-06-12
  Administered 2019-06-11 – 2019-06-12 (×2): 40 mg via ORAL

## 2019-06-10 MED ORDER — INSULIN GLARGINE 100 UNIT/ML (3 ML) SC INJ PEN
70 [IU] | Freq: Every day | SUBCUTANEOUS | 0 refills | Status: DC
Start: 2019-06-10 — End: 2019-06-12

## 2019-06-10 MED ORDER — ENOXAPARIN 40 MG/0.4 ML SC SYRG
40 mg | Freq: Every day | SUBCUTANEOUS | 0 refills | Status: DC
Start: 2019-06-10 — End: 2019-06-12
  Administered 2019-06-11 – 2019-06-12 (×2): 40 mg via SUBCUTANEOUS

## 2019-06-10 NOTE — Care Coordination-Inpatient
For any questions prior to 8am please call Team  Med Private Swing 6, pager [phone]. After 8am contact Team  Med Private L, pager [phone]

## 2019-06-10 NOTE — Progress Notes
General Progress Note    Name:  Traci Wade   ZOXWR'U Date:  06/10/2019  Admission Date: 06/09/2019  LOS: 1 day                     Assessment/Plan:    Principal Problem:    Hypertriglyceridemia    54 year old female with past medical history of CAD s/p PCI, DM-II, HTN/ DLD, and recent diagnosis of Covid 19 PNA last month presented to her PCP's office on 06/09/19 for continued shortness of breath.  Imaging showed improving PNA but labs showed hypertriglyceridemia.  She was then directly admitted to [facility] for further work up.     Hypertriglyceridemia:  - Triglycerides 5558 on lipid panel   - lipase negative and no signs of pancreatitis on CT  - continue home home rosuvastatin 40mg  daily  - started on finofibrate 145mg  daily  - continue fish oil  - nutrition consulted about diet recs    Hepatomegaly/ Hepatic Steatosis:  - CT A/P with moderate hepatomegaly and diffuse hepatic steatosis  - Hepatology consulted    Recent Covid PNA:   - Per report, CT done by PCP on 6/8 with improving b/l PNA  - repeat Covid 19 test negative    CAD/ HTN:  - s/p DES to the LAD in 05/2018  - continue PTA ASA, rosuvastatin, and metoprolol    DM-II:  - HbA1c 9.4  - Increase Lantus to 70U QAM and 40U QPM  - change to moderate dose correction factor     Other:  - continue PTA citalopram     FEN:  - diabetic diet    Ppx:  - Lovneox sq     FULL CODE     Eloise Levels, MD  Med Private L Service  Pager: [phone]      ________________________________________________________________________    Subjective  Traci Wade is a 54 y.o. female.  No acute events overnight.  Patient still reports some shortness of breath and low energy but no new acute changes over the past few weeks.  Still with some RUQ pain with palpation.  Denies any changes in the pain with movement or eating.     Medications  Scheduled Meds:aspirin EC tablet 81 mg, 81 mg, Oral, QDAY  citalopram (CeleXA) tablet 20 mg, 20 mg, Oral, QDAY fenofibrate nanocrystallized (TRICOR) tablet 145 mg, 145 mg, Oral, QDAY  fish oil- omega 3-DHA/EPA capsule 1,000 mg, 1,000 mg, Oral, BID w/meals  insulin aspart U-100 (NOVOLOG FLEXPEN) injection PEN 0-6 Units, 0-6 Units, Subcutaneous, ACHS (22)  insulin glargine (LANTUS SOLOSTAR) injection PEN 35 Units, 35 Units, Subcutaneous, QHS(22)  insulin glargine (LANTUS SOLOSTAR) injection PEN 65 Units, 65 Units, Subcutaneous, QDAY  metoprolol XL (TOPROL XL) tablet 25 mg, 25 mg, Oral, QDAY  pneumococcal 23-val vaccine (PPSV23) (PNEUMOVAX 23) injection 0.5 mL, 0.5 mL, Intramuscular, ONCE  simvastatin (ZOCOR) tablet 40 mg, 40 mg, Oral, QHS    Continuous Infusions:  PRN and Respiratory Meds:acetaminophen Q6H PRN, bisacodyL QDAY PRN, melatonin QHS PRN, ondansetron Q6H PRN **OR** ondansetron (ZOFRAN) IV Q6H PRN, polyethylene glycol 3350 QDAY PRN    Objective:                          Vital Signs: Last Filed                 Vital Signs: 24 Hour Range   BP: 151/94 (06/09 2200)  Temp: 36.6 ???C (97.9 ???F) (06/09  2200)  Pulse: 77 (06/10 0400)  Respirations: 18 PER MINUTE (06/09 2200)  SpO2: 98 % (06/09 2200)  Height: 165.1 cm (65) (06/09 2200) BP: (135-151)/(76-94)   Temp:  [36.6 ???C (97.9 ???F)-37.1 ???C (98.8 ???F)]   Pulse:  [72-98]   Respirations:  [18 PER MINUTE]   SpO2:  [95 %-98 %]    Intensity Pain Scale (Self Report): 8 (06/10/19 0453) Vitals:    06/09/19 1441 06/09/19 2200   Weight: 82.9 kg (182 lb 12.2 oz) 82.3 kg (181 lb 6.4 oz)       Intake/Output Summary:  (Last 24 hours)    Intake/Output Summary (Last 24 hours) at 06/10/2019 0610  Last data filed at 06/10/2019 0449  Gross per 24 hour   Intake 1150 ml   Output 2400 ml   Net -1250 ml      Stool Occurrence: 1    Physical Exam  Gen: NAD  CV: RRR, no m/r/g  Pulm: CTAB.  Breathing okay off supplemental oxygen  GI: soft, mild tenderness to palpation in RUQ   Ext: no pitting edema     Lab Review  24-hour labs:    Results for orders placed or performed during the hospital encounter of 06/09/19 (from the past 24 hour(s))   COVID-19 (SARS-COV-2) PCR    Collection Time: 06/09/19  3:45 PM   Result Value Ref Range    COVID-19 (SARS-CoV-2) PCR Source NASOPHARYNGEAL SWAB     COVID-19 (SARS-CoV-2) PCR NOT DETECTED DN-NOT DETECTED   CBC    Collection Time: 06/09/19  4:00 PM   Result Value Ref Range    White Blood Cells 8.4 4.5 - 11.0 K/UL    RBC 3.68 (L) 4.0 - 5.0 M/UL    Hemoglobin 10.0 (L) 12.0 - 15.0 GM/DL    Hematocrit 29.5 (L) 36 - 45 %    MCV 83.2 80 - 100 FL    MCH 27.3 26 - 34 PG    MCHC 32.8 32.0 - 36.0 G/DL    RDW 62.1 11 - 15 %    Platelet Count 353 150 - 400 K/UL    MPV 7.6 7 - 11 FL   COMPREHENSIVE METABOLIC PANEL    Collection Time: 06/09/19  4:00 PM   Result Value Ref Range    Sodium 136 (L) 137 - 147 MMOL/L    Potassium 4.3 3.5 - 5.1 MMOL/L    Chloride 101 98 - 110 MMOL/L    Glucose 346 (H) 70 - 100 MG/DL    Blood Urea Nitrogen 11 7 - 25 MG/DL    Creatinine 3.08 0.4 - 1.00 MG/DL    Calcium 9.9 8.5 - 65.7 MG/DL    Total Protein 6.5 6.0 - 8.0 G/DL    Total Bilirubin 0.6 0.3 - 1.2 MG/DL    Albumin 3.7 3.5 - 5.0 G/DL    Alk Phosphatase 846 25 - 110 U/L    AST (SGOT) 47 (H) 7 - 40 U/L    CO2 21 21 - 30 MMOL/L    ALT (SGPT) 34 7 - 56 U/L    Anion Gap 14 (H) 3 - 12    eGFR Non African American >60 >60 mL/min    eGFR African American >60 >60 mL/min   LIPASE    Collection Time: 06/09/19  4:00 PM   Result Value Ref Range    Lipase 59 11 - 82 U/L   HEMOGLOBIN A1C    Collection Time: 06/09/19  4:00 PM   Result Value Ref  Range    Hemoglobin A1C 9.4 (H) 4.0 - 6.0 %   LIPID PROFILE    Collection Time: 06/09/19  4:00 PM   Result Value Ref Range    Cholesterol 609 (H) <200 MG/DL    Triglycerides 1,610 (H) <150 MG/DL    HDL 30 (L) >96 MG/DL    LDL 045 (H) <409 mg/dL    VLDL 8,119 MG/DL    Non HDL Cholesterol 579 MG/DL   TROPONIN-I    Collection Time: 06/09/19  4:00 PM   Result Value Ref Range    Troponin-I 0.00 0.0 - 0.05 NG/ML   BNP (B-TYPE NATRIURETIC PEPTI)    Collection Time: 06/09/19  4:00 PM Result Value Ref Range    B Type Natriuretic Peptide 11.0 0 - 100 PG/ML   POC GLUCOSE    Collection Time: 06/09/19  4:30 PM   Result Value Ref Range    Glucose, POC 330 (H) 70 - 100 MG/DL   POC GLUCOSE    Collection Time: 06/09/19  9:23 PM   Result Value Ref Range    Glucose, POC 280 (H) 70 - 100 MG/DL   CBC    Collection Time: 06/10/19  5:09 AM   Result Value Ref Range    White Blood Cells 7.8 4.5 - 11.0 K/UL    RBC 3.81 (L) 4.0 - 5.0 M/UL    Hemoglobin 10.9 (L) 12.0 - 15.0 GM/DL    Hematocrit 14.7 (L) 36 - 45 %    MCV 83.9 80 - 100 FL    MCH 28.6 26 - 34 PG    MCHC 34.1 32.0 - 36.0 G/DL    RDW 82.9 11 - 15 %    Platelet Count 344 150 - 400 K/UL    MPV 6.7 (L) 7 - 11 FL   COMPREHENSIVE METABOLIC PANEL    Collection Time: 06/10/19  5:09 AM   Result Value Ref Range    Sodium 133 (L) 137 - 147 MMOL/L    Potassium 3.8 3.5 - 5.1 MMOL/L    Chloride 98 98 - 110 MMOL/L    Glucose 240 (H) 70 - 100 MG/DL    Blood Urea Nitrogen 9 7 - 25 MG/DL    Creatinine 5.62 0.4 - 1.00 MG/DL    Calcium 9.3 8.5 - 13.0 MG/DL    Total Protein 6.8 6.0 - 8.0 G/DL    Total Bilirubin 0.3 0.3 - 1.2 MG/DL    Albumin 3.7 3.5 - 5.0 G/DL    Alk Phosphatase 86 25 - 110 U/L    AST (SGOT) 41 (H) 7 - 40 U/L    CO2 20 (L) 21 - 30 MMOL/L    ALT (SGPT) 31 7 - 56 U/L    Anion Gap 15 (H) 3 - 12    eGFR Non African American >60 >60 mL/min    eGFR African American >60 >60 mL/min   POC GLUCOSE    Collection Time: 06/10/19  7:23 AM   Result Value Ref Range    Glucose, POC 297 (H) 70 - 100 MG/DL   POC GLUCOSE    Collection Time: 06/10/19 12:32 PM   Result Value Ref Range    Glucose, POC 331 (H) 70 - 100 MG/DL       Point of Care Testing  (Last 24 hours)  Glucose: (!) 346 (06/09/19 1600)  POC Glucose (Download): (!) 280 (06/09/19 2123)

## 2019-06-10 NOTE — Care Plan
Problem: Infection, Risk of  Goal: Absence of infection  Outcome: Goal Ongoing     Problem: Infection, Risk of  Goal: Knowledge of Infection Control Procedures  Outcome: Goal Ongoing   All meds and procedures explained prior to initiated

## 2019-06-10 NOTE — Consults
Hepatology Consult Note  Patient Traci Wade         ZOX:[id number]  Admission Date: 06/09/2019  2:10 PM      Principal Problem:    Hypertriglyceridemia      History of Present Illness/Subjective:  Traci Wade is a 54 y.o. female with history of HTN, HLD, DM on insulin, CAD s/p DES 1 year ago, recent COVID pneumonia who presents with ongoing SOB and RUQ pain as well as severe hypertriglyceridemia.    Patient reports that she was in her normal state of health until about 3 weeks ago when she had worsening shortness of breath, cough, and fever.  She was admitted at a local hospital and tested positive for COVID.  She reports that she required oxygen but was never on a ventilator.  Since discharge, she is slowly been improving but still feels short of breath with activity.  Recently followed up with her PCP.  As she was still feeling short of breath, CT was ordered and was negative for PE and showed improving pneumonia.  Lab work showed lipemic appearing blood.  She was found to have significantly elevated triglycerides.  Additionally, the patient was noted to have right upper quadrant tenderness.  Her PCP referred her to [facility] for further evaluation.    Patient reports that she has noticed some right upper quadrant and epigastric pain for the past 2-3 weeks.  Pain is cramping, intermittent, it is sometimes worse after eating, worse with palpation.  Better with bowel movements.  Does not change with cough or deep breath.  Pain is not severe. She reports nausea but has not had any vomiting.  Has been able to eat some.  She denies any jaundice or pruritus.  No episodes of confusion.  She denies any melena but has had episodes of hematochezia.  She reports approximately 2 weeks ago she had a large amount of frank red blood in stool.  This was a single isolated episode and has not recurred since that time.  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  She denies any alcohol use for the past 20 years.  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  She reports she has been tested for hepatitis previously and was negative.  She is a history of diabetes requiring insulin but reports she has been taking her insulin as usual.  She denies any history of DKA.  She denies any history of pancreatitis    Assessment/ Plan:  [name] is a 54 y.o. female with history of HTN, HLD, DM on insulin, CAD s/p DES 1 year ago, recent COVID pneumonia who presents with ongoing SOB and RUQ pain as well as severe hypertriglyceridemia.    RUQ/Epigastric Pain  Severe Hypertriglyceridemia  Hepatic Steatosis  -Intermittent, cramping, sometimes worse with eating and better with BM. Endorses isolated episode of hematochezia 2 weeks ago. Nausea w/o vomiting. No jaundice.  -Afebrile, HDS  -Labs notable for Hgb 10.9 (previous baseline 12), no leukocytosis, PLT 344. LFTs with AST 41 (mild chronic elevation in AST noted), ALT 31, Tbili 0.3, ALP 86. Triglycerides elevated to 5558 (previously 400-500). Lipase 59. AGMA. BNP normal.   -CT a/p w/contrast: Moderate hepatomegaly. Diffuse hepatic steatosis. The major portal veins are patent. No biliary ductal dilation. Prior cholecystectomy. Mild splenomegaly. The large and small bowel are normal caliber. Mild distal colonic diverticulosis without evidence of diverticulitis. The appendix is normal. No  mesenteric adenopathy, ascites, or pneumoperitoneum. The pancreas is unremarkable    -Unclear connection between abdominal pain and hypertriglyceridemia. She has no CT or lab evidence of pancreatitis. She is at risk for pancreatitis with continued elevation in her triglycerides. She has signs of hepatic steatosis with hepatomegaly but no evidence of acute hepatitis on labs. No prior liver imaging for comparison. Most likely, her liver imaging represents chronic NAFLD given her previously elevated AST and co-morbidities. This is likely related to her chronic HLD but unlikely a result of acute elevation in triglycerides. She does have mild AGMA and is an insulin requiring diabetic, DKA could be considered. She denies any alcohol consumption.       Anemia  Reported Hematochezia  -Hgb 10.9 from 10 at presentation. Previous baseline 12.2  -Reported an isolated episode of hematochezia 2 weeks ago but none since  -Reports last colonoscopy was about 3 years ago, has a hx of colonic polyps. No records available    Recent COVID Pneumonia  -Endorses continued SOB but afebrile and on room air  -Repeat testing negative at admission    Recommendations:  -Recommend endocrinology consult for further management of hypertriglyceridemia, such as insulin gtt or consideration for apheresis.   -Low fat diet  -Trend triglyerides  -Monitor for overt GI bleeding, Trend Hgb      Patient seen/discussed with Dr. Alto Denver  GI fellow   Pager [phone]  06/10/2019 1:26 PM       -----------------------------  PMH:  Medical History:   Diagnosis Date   ??? Chest pain    ??? Coronary artery disease of native artery of native heart with stable angina pectoris [facility]) 06/12/2018    06/12/2018 - cath with high grade stenosis in the mid left anterior descending artery status post percutaneous coronary intervention with drug-eluting stent x1.  Initiated on aspirin and Brilinta   ??? Diabetes (HCC)    ??? Dyslipidemia, goal LDL below 70 06/12/2018   ??? Edema    ??? HTN (hypertension)    ??? Hyperlipemia    ??? Hypertension 05/29/2018   ??? Syncope and collapse    ??? TIA (transient ischemic attack)    ??? Type 2 diabetes mellitus with ophthalmic complication (HCC) 06/12/2018       Current medications:  No current facility-administered medications on file prior to encounter.      Current Outpatient Medications on File Prior to Encounter   Medication Sig Dispense Refill ??? albuterol 0.083% (PROVENTIL) 2.5 mg /3 mL (0.083 %) nebulizer solution Inhale  solution by nebulizer as directed as Needed.     ??? allopurinol (ZYLOPRIM) 300 mg tablet Take 300 mg by mouth as Needed. Take with food.      ??? aspirin EC 81 mg tablet Take one tablet by mouth daily. Take with food. 30 tablet 5   ??? cholecalciferol(+) (Vitamin D3) (OPTIMAL D3) 50,000 units capsule Take 50,000 Units by mouth every 7 days.  3   ??? citalopram (CELEXA) 20 mg tablet Take 20 mg by mouth daily.     ??? furosemide (LASIX) 20 mg tablet Take 20 mg by mouth twice daily. Patient takes 40mg  in the morning and 20mg  in the afternoon     ??? insulin glargine (LANTUS) 100 unit/mL injection Inject  under the skin. inject 68 units in the morning and 35 units at bedtime     ??? insulin regular(DIL) (HUMULIN R) 10 units/mL Inject  under the skin three times daily before meals. Take as directed      ???  metFORMIN-XR(+) (GLUCOPHAGE XR) 750 mg extended release tablet Take one tablet by mouth twice daily with meals. Ok to resume on June 15, 2018 90 tablet 3   ??? metoprolol XL (TOPROL XL) 25 mg extended release tablet Take 25 mg by mouth daily.  3   ??? nitroglycerin (NITROSTAT) 0.4 mg tablet DISSOLVE ONE TABLET UNDER THE TONGUE EVERY 5 MINUTES AS NEEDED FOR CHEST PAIN.  DO NOT EXCEED A TOTAL OF 3 DOSES IN 15 MINUTES 25 tablet 0   ??? PROAIR HFA 90 mcg/actuation inhaler Inhale 2 puffs by mouth into the lungs as Needed.  0   ??? rosuvastatin (CRESTOR) 40 mg tablet Take one tablet by mouth daily. 90 tablet 3   ??? SKYRIZI 75 mg/0.83 mL syringe Inject 150 mg under the skin every 90 days.     ??? tiotropium (SPIRIVA WITH HANDIHALER) 18 mcg capsule for inhaler Place 18 mcg into inhaler and inhale into lungs as directed daily.         PSH:  Surgical History:   Procedure Laterality Date   ??? ANGIOGRAPHY CORONARY ARTERY WITH LEFT HEART CATHETERIZATION N/A 06/12/2018    Performed by Nat Math, MD at [facility] [place] CATH LAB ??? POSSIBLE PERCUTANEOUS CORONARY STENT PLACEMENT WITH ANGIOPLASTY N/A 06/12/2018    Performed by Nat Math, MD at [facility] CATH LAB       SH:  Social History     Socioeconomic History   ??? Marital status: Divorced     Spouse name: Not on file   ??? Number of children: Not on file   ??? Years of education: Not on file   ??? Highest education level: Not on file   Occupational History   ??? Not on file   Tobacco Use   ??? Smoking status: Former Smoker     Types: Cigarettes   ??? Smokeless tobacco: Never Used   Substance and Sexual Activity   ??? Alcohol use: Not Currently   ??? Drug use: Never   ??? Sexual activity: Not on file   Other Topics Concern   ??? Not on file   Social History Narrative   ??? Not on file       FH:  Family History   Problem Relation Age of Onset   ??? Cancer Mother    ??? Heart Attack Father    ??? Cancer Sister    ??? Premature Heart Disease Sister    ??? Stroke Maternal Grandmother        Review of Systems:  Constitutional: No fevers, chills, weight loss  Eyes: No vision deficit, no icterus  Ears, nose, mouth: No oral bleeding, ulcer  Cardiovascular: No chest pain, palpitations  Respiratory: No dyspnea, cough   Gastrointestinal: See HPI  Musculoskeltal: No joint inflammation, new deformity  Integumentary: No rashes, exudate  Neurologic: No cognitive change, focal weakness  Hematologic: No for easy bleeding or bruising  Please see HPI for additional pertinent documentation    Physical Exam:  Vitals:    06/10/19 0000 06/10/19 0400 06/10/19 0728 06/10/19 1128   BP:   136/73 128/75   BP Source:   Arm, Left Upper Arm, Left Upper   Pulse: 72 77 67 75   Temp:   36.7 ???C (98 ???F) 36.9 ???C (98.5 ???F)   SpO2:   92% 92%   Weight:       Height:         Constitutional- Vitals above, no acute distress.   Head - Normocephalic, atraumatic.   Eyes -  EOMI grossly. No icterus or injection.   Ears, nose, mouth, throat- No oral ulcer or bleeding.   Neck - No swelling or tracheal deviation. Respiratory - Symmetric chest rise, no increased work of breathing.  Cardiovascular - Peripheral pulses intact, no pedal edema.  Gastrointestinal- Soft, TTP in epigastrium and RUQ. No distension. No palpable hepatomegaly.  Skin - No exposed rash, lesion.  Neurologic - No CN deficit, normal fluid speech  Psychiatric - Judgement intact, thought content appropriate.    Labs/Imaging:  Pertient labs/imaging were reviewed on initiation of progress note.

## 2019-06-10 NOTE — Progress Notes
Patient arrived to room # [phone]) via wheelchair accompanied by transport. Patient transferred to the bed without assistance. Bedside safety checks completed. Initial patient assessment completed. Refer to flowsheet for details.    Admission skin assessment completed with: Berenice Bouton. RN    Pressure injury present on arrival?: No    1. Head/Face/Neck: No  2. Trunk/Back: No  3. Upper Extremities: No  4. Lower Extremities: No  5. Pelvic/Coccyx: No  6. Assessed for device associated injury? Yes  7. Malnutrition Screening Tool (Nursing Nutrition Assessment) Completed? Yes    See Doc Flowsheet for additional wound details.     INTERVENTIONS:

## 2019-06-10 NOTE — Case Management (ED)
Case Management Admission Assessment    NAME:Traci Wade                          MRN: [id number]             DOB:November 24, 1965          AGE: 54 y.o.  ADMISSION DATE: 06/09/2019             DAYS ADMITTED: LOS: 1 day      Today???s Date: 06/10/2019     Per emr, 54 year old female with past medical history of CAD s/p PCI, DM-II, HTN/ DLD, and recent diagnosis of Covid 19 PNA last month presented to her PCP's office on 06/09/19 for continued shortness of breath.  Imaging showed improving PNA but labs showed hypertriglyceridemia.  She was then directly admitted to [facility] for further work up.     Source of Information: pt and emr       Plan  Plan: Case Management Assessment, Discharge Planning for Home Anticipated, Assist PRN with SW/NCM Services    ??? D/t ongoing precautions to prevent the spread of COVID-19, CM management has asked that face-to-face interaction w/ pt's be limited. Called patient in her room and explained role in r/t d/c planning. Discussed handouts of [facility] brochure, Preferred Provider Network brochure, Preparing for Discharge Handout and Contact information.   ??? Pt lives in [place] with her 3 adoptive children ages 71-12. Pt also has adult children that live in the [place] area. 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 Updated admitting that this inpt stay should be billed to them. Claim # [id number]. Shanda Bumps is her case worker in [place] ph [phone], but a local rep should be following this case. Any d/c needs will need to be coordinated with [employer].   ??? Covid testing negative.   ??? Will continue to follow and assist with d/c planning.     Patient Address/Phone  [address]  [place] [address]  [phone] (home)     Emergency Contact  Extended Emergency Contact Information  Primary Emergency Contact: Boneta Lucks  Home Phone: [phone]  Relation: Relative    Healthcare Directive Healthcare Directive: No, patient does not have a healthcare directive  Would patient like to fill out a (a new) Healthcare Directive?: Yes, referral to Social Work  Psych Advance Directive (Psych unit only): No, patient does not have a Social research officer, government  Does the patient need discharge transport arranged?: No  Transportation Name, Phone and Availability #1: pts adult son will provide transport at time of d/c and he is off till [place]. he will be coming from [place].   Does the patient use Medicaid Transportation?: No    Expected Discharge Date  Expected Discharge Date: 06/11/19  Expected Discharge Time: 1400    Living Situation Prior to Admission  ? Living Arrangements  Type of Residence: Home, independent  Living Arrangements: Children(has 3 adoptive children at home ages 12-12. pts neighbor is caring for the children while pt is in the hospital. )  Financial risk analyst / Tub: Tub/Shower Unit, Walk-in Shower  Can patient live on one level if needed?: Yes  Does residence have entry and/or side stairs?: Yes  Assistance needed prior to admit or anticipated on discharge: No  Who provides assistance or could if needed?: pt has adult children that live out of the  home that can help pt prn  Are they in good health?: Yes  Can support system provide 24/7 care if needed?: Maybe  ? Level of Function   Prior level of function: Independent  ? Cognitive Abilities   Cognitive Abilities: Alert and Oriented, Engages in problem solving and planning, Recognizes impact of health condition on lifestyle, Participates in decision making    Financial Resources  ? Coverage  Primary Insurance: [employer] Comp  Secondary Insurance: Theatre manager Coverage: RX    ? 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 Financial Assistance Needed?  No reported concerns    Psychosocial Needs  ? Mental Health Mental Health History: Yes(pt reports hx of depression that she is on medication for that is prescribed by her PCP)  ? Substance Use History  Substance Use History Screen: No  ? Other  na    Current/Previous Services  ? PCP  [place], [place], [phone], [phone]  ? Pharmacy    [facility]  [address]  [place] [address]  Phone: [phone] Fax: [phone]    ? Durable Medical Equipment   Durable Medical Equipment at home: None  ? Home Health  Receiving home health: No  ? Hemodialysis or Peritoneal Dialysis  Undergoing hemodialysis or peritoneal dialysis: No  ? Tube/Enteral Feeds  Receive tube/enteral feeds: No  ? Infusion     ? Private Duty  Private duty help used: No  ? Home and Community Based [employer] and community based services: No  ? [facility]ack[facility]: N/A  ? Hospice  Hospice: No  ? Outpatient Therapy  PT: No  OT: No  SLP: No  ? Skilled Nursing Facility/Nursing Home  SNF: No  NH: No  ? Inpatient Rehab  IPR: No  ? Long-Term Acute Care Hospital  LTACH: No  ? Acute Hospital Stay  Acute Hospital Stay: In the past  Was patient's stay within the last 30 days?: No    Gearlean Alf Integrated Nurse Case Manager Bsn Rn-ACM  Ph [phone]  Pager [phone]

## 2019-06-10 NOTE — Progress Notes
Discontinuation of Transmission-Based Precautions for COVID-19 and Cleaning of Room Process    IPAC consulted for consideration to remove patient from COVID-19 isolation. It was determined that patient meets recovery criteria on 6/9.  COVID flag resolved and isolation orders discontinued. Patient is to remain in isolation until transferred to a new unit/clean room.     Please note that subsequent COVID PCR tests may continue to result positive; however, that is not reflective of active viral infection. Patients are known to test positive via PCR for weeks after the infectious phase of illness (see reference below for further info).    Room cleaning post-discharge of suspected or confirmed COVID-19 patients:  ? INPATIENT UNITS following discharge of patient in Contact and Droplet precautions with eye protection   ? Unit staff strips room and wipes down any equipment that will leave the room (ex. IV pump, SCD pumps)   ? Isolation signage should remain in place for clear communication to EVS staff   ? No downtime of room is required before terminal clean can be performed by EVS staff   ? EVS staff will use Contact and Droplet Precautions with eye protection   ? PPE needed: surgical mask, goggles/face shield, gown, and gloves  ? Use Oxycide with appropriate contact/dwell time   ? Room can re-open when dry  ? INPATIENT UNITS following discharge of patient in Airborne and Contact precautions   ? Unit staff strips room and wipes down any equipment that will leave the room (ex. IV pump, SCD pumps)   ? Isolation signage should remain in place for clear communication to EVS staff   ? Room must be closed for 30 minutes prior to cleaning and maintain Airborne Respirator/Contact Precautions during that time.   ? After the room has been closed for 30 minutes, EVS may be contacted for a terminal clean of the room.   ? EVS staff will use Contact and Droplet Precautions with eye protection ? PPE needed: surgical mask, goggles/face shield, gown, and gloves  ? Use Oxycide with appropriate contact/dwell time   ? Room can re-open when dry      Reference information from the policy ???Care of a Patient with COVID-19???  Recovery criteria:  Current guidance for discontinuation of isolation precautions applies to COVID-19 positive patients on acute care status. ICU status COVID-19 patients will remain in isolation for duration of ICU encounter.     COVID-19 patients with confirmatory laboratory testing on acute care status are considered recovered and isolation precautions can be discontinued if the following criteria are met:  ? At least 3 days (72 hours) have passed since recovery defined as resolution of fever without the use of fever-reducing medications and improvement in respiratory symptoms (e.g., cough, shortness of breath); and,   ? At least 14 days have passed since first diagnostic test.    Patients on acute-care status with high suspicion of having COVID-19 without confirmatory laboratory testing are considered recovered and can be removed from isolation when the following criteria are met:  ? At least 3 days (72 hours) have passed since recovery defined as resolution of fever without the use of fever-reducing medications and improvement in respiratory symptoms (e.g., cough, shortness of breath); and,   ? At least 14 days have passed since symptoms first appeared.    Rationale: At this time, replication-competent virus has not been successfully cultured more than 9 days after onset of illness. The statistically estimated likelihood of recovering replication-competent virus approaches zero by 10 days (  CDC unpublished data, W???lfel 2020, Arons 2020). Available data indicate that shedding of SARS-CoV-2 RNA in upper respiratory specimens declines after onset of symptoms. At 10 days after illness onset, recovery of replication-competent virus in viral culture (as a proxy of the presence of infectious virus) is decreased and approaches zero. Although patients may produce PCR-positive specimens for up to 6 weeks Leeroy Cha, 2020), it remains unknown whether these PCR-positive samples represent the presence of infectious virus. After clinical recovery, many patients do not continue to shed SARS-CoV-2 viral RNA. Among recovered patients with detectable RNA in upper respiratory specimens, concentrations of RNA after 3 days are generally in ranges where virus has not been reliably cultured by CDC. These data have been generated from adults across a variety of age groups and with varying severity of illness.    We rely on the best evidence available to approximate when and for how long someone may be infectious to others. The health system's discontinuation of isolation guideline was developed with more stringent criteria than what is currently recommended by state and national public health officials. We acknowledge that illness onset likely occurs prior to diagnostic testing. Setting recovery criteria 14 days after the first diagnostic test assures that we are isolating beyond the duration of infectivity based on available evidence.

## 2019-06-11 ENCOUNTER — Encounter: Admit: 2019-06-11 | Discharge: 2019-06-11

## 2019-06-11 LAB — POC GLUCOSE
Lab: 206 mg/dL — ABNORMAL HIGH (ref 70–100)
Lab: 288 mg/dL — ABNORMAL HIGH (ref 70–100)
Lab: 292 mg/dL — ABNORMAL HIGH (ref 70–100)
Lab: 325 mg/dL — ABNORMAL HIGH (ref 70–100)
Lab: 340 mg/dL — ABNORMAL HIGH (ref 70–100)

## 2019-06-11 LAB — LIPID PROFILE
Lab: 25 mg/dL — ABNORMAL LOW (ref >40)
Lab: 305 mg/dL — ABNORMAL HIGH (ref <150)
Lab: 482 mg/dL
Lab: 507 mg/dL — ABNORMAL HIGH (ref <200)
Lab: 610 mg/dL
Lab: 76 mg/dL (ref <100)

## 2019-06-11 LAB — COMPREHENSIVE METABOLIC PANEL: Lab: 131 MMOL/L — ABNORMAL LOW (ref 137–147)

## 2019-06-11 LAB — CBC: Lab: 7.4 10*3/uL — ABNORMAL LOW (ref 4.5–11.0)

## 2019-06-11 LAB — TRIGLYCERIDE: Lab: 330 mg/dL — ABNORMAL HIGH (ref <150)

## 2019-06-11 LAB — IRON + BINDING CAPACITY + %SAT+ FERRITIN
Lab: 18 ng/mL (ref 10–200)
Lab: 33 ug/dL — ABNORMAL LOW (ref 50–160)

## 2019-06-11 MED ORDER — OMEGA 3-DHA-EPA-FISH OIL 300-1,000 MG PO CPDR
2000 mg | Freq: Two times a day (BID) | ORAL | 0 refills | Status: DC
Start: 2019-06-11 — End: 2019-06-12
  Administered 2019-06-11 – 2019-06-12 (×2): 2000 mg via ORAL

## 2019-06-11 MED ORDER — METFORMIN 500 MG PO TAB
500 mg | Freq: Two times a day (BID) | ORAL | 0 refills | Status: DC
Start: 2019-06-11 — End: 2019-06-12
  Administered 2019-06-11 – 2019-06-12 (×2): 500 mg via ORAL

## 2019-06-11 NOTE — Consults
CLINICAL NUTRITION                                                        Clinical Nutrition Initial Assessment    Name: Traci Wade        MRN: [id number]          DOB: Feb 23, 1965          Age: 54 y.o.  Admission Date: 06/09/2019             LOS: 2 days        Recommendation:  Recommend Cardiac + Diabetic 60 g CHO/meal diet.    Comments:  Consult received for DM/high TG diet education. 54 year old female with past medical history of CAD s/p PCI, DM-II, HTN/ DLD, and recent diagnosis of Covid 19 PNA last month presented to her PCP's office on 06/09/19 for continued shortness of breath.  Imaging showed improving PNA but labs showed hypertriglyceridemia.  She was then directly admitted to [facility] for further work up. Pt reports allergy to mushrooms, updated EMR. Pt reports decreased appetite over the past month after contracting COVID-19. Initially had altered taste, now resolved, but early satiety and low appetite remain. Eating better, 2-3 meals/day. Initially lost wt from ~180 lbs to 168 lbs, has since gained back. Reports she must eat slowly to reduce discomfort, nausea. Breakfast tray at bedside, 75% consumed. Eating 100% of meals since admission per nursing documentation. Has been eating quite a bit of popcorn at home, thinks this is what helped her gain wt back. + abdominal pain and nausea presently. Pt reports she does not like to take meds. Discussed importance of good nutrition, how antiemetics and pain meds can help improve appetite if she is having nausea and pain. LBM was 6/10. Wt is -0.6 kg since admission. No pressure injuries or edema noted. A1c is 9.4, FSBS ranging from 280-430 mg/dL since admission. TG=5558. Educated pt on consistent carb, 60 g CHO/meal diet, limiting fat intake, increasing fiber, fruits, vegetables once abdominal pain is improved. She reported understanding. Anticipate minimal compliance. Provided handouts and contact information. She is not currently at nutritional risk. Will re-eval early to monitor PO intake.    Nutrition Assessment of Patient:  Admit Weight: 82.9 kg;  ; Desired Weight: 67.9 kg  BMI (Calculated): 30.19; BMI Categories Adult: Obesity Class I: 30-34.9;    Pertinent Allergies/Intolerances: mushrooms  Pertinent Labs: reviewed; Pertinent Meds: reviewed;    Oral Diet Order: Diabetic 1600-2000 Kcal/day (60 g carb/meal, 30 g carb/HS snack);    Current Oral Intake: Marginally Adequate  Estimated Calorie Needs: 1560-1700 kcal/day(23-25 kcal/kg DBW)  Estimated Protein Needs: 80 g/day(1.2 g/kg DBW)        Appreciate the consult.      Crissie Sickles, BS-NDTR, MIEP  [phone]  Office: [phone]

## 2019-06-11 NOTE — Consults
Admission History and Physical Examination      Name:  Traci Wade                                             MRN:  [id number]   Admission Date:  06/09/2019                     Assessment/Plan:    Principal Problem:    Hypertriglyceridemia    54 yo F W/PMH CAD s/p PCI (June 2019), DM II, HTN, dyslipidemia, COVID-19 positive pneumonia last month who was admitted for further work-up of hypertriglyceridemia.     #Hypertriglyceridemia   - Lipid panel 12/25/18: chol 351; trigly 397; HDL 49; LDL 217; VLDL 79  - Lipid panel 06/09/19: chol 609; trigly 5558; HDL 30; LDL 118; VLDL 1112  - Lipid panel 06/11/19: chol 507; trigly 3052; HDL 25; LDL 76; VLDL 610  - patient placed on fish oil 1g BID and fenofibrate 145mg  qday this admission   - patient on PTA rosuvastatin 40mg  qday   Plan:   > increased fish oil to 2g BID. Asked case manager to look into Lovaza vs Vascepa for fish oil concentrate affordability on discharge  > continue fenofibrate 145mg  qday   > continue rosuvastatin 40mg  qday   > repeat lipid panel in AM of 6/12  > patient will need repeat lipid panel 1 week post d/c to re-evaluate   > can consider calorie restriction       #DM II   - patient on PTA insulin glargine 68u QAM and 35u QPM   - patient on PTA metformin XL 750 BID. Could not tolerate metformin 1000 BID, but she was taking her first dose with her one full meal of the day around 1500. She does not eat very much for dinner (typically a snack) and will take her second dose of metformin at that time   - does not take meal time insulin d/t her work hours   - hgb A1C (06/09/19) 9.4%   - currently, blood sugars are measuring around 280-340's on Insulin glargine 70u QAM and 40u QPM and MDCF   Plan:   > will start patient on metformin 500 BID while admitted. Goal is to get patient to either metformin 1000 BID or Metformin XL 750 3 pills per day (could take 2 tabs Q afternoon with her main meal and continue taking her 1 tab of 750mg  QPM with her smaller meal/snack). However, this may cause an issue when we attempt to add a GLP-1 to her regimen as the GLP-1 in combination with metformin may precipitate diarrhea, which she experienced in the past on metformin.   > asked nurse case manager to check affordability of Victoza vs Trulicity vz Ozempic. We would like to begin patient on GLP-1   > Continue insulin glargine 70u QAM and 40u QPM   __________________________________________________________________________________  Primary Care Physician: Rockwell [place]  Verified    Chief Complaint:  Hypertriglyceridemia     History of Present Illness: Traci Wade is a 54 y.o. female W/PMH CAD s/p PCI (June 2019), DM II, HTN, dyslipidemia, COVID-19 positive pneumonia last month who was admitted for further work-up of hypertriglyceridemia.    On 6/9, the patient went to her PCP for continued SOB after having COVID 19+ pneumonia about 1 month prior.  Imaging showed improved pneumonia,  but lab work showed the triglyceridemia >5 K.    Upon interviewing the patient, the patient does report intermittent, diffuse, crampy abdominal pain.  She reports a history of alternating diarrhea and constipation however her last bowel movement was yesterday and appeared normal.  CT A/P was negative for pancreatitis and lipase on admission was 59.  She denies fever/chills/nausea/vomiting or current constipation or diarrhea.  Triglyceride levels have decreased 5558 (6/9) to 3052 (6/11).  Notably, she has been on a diabetic diet since admission.    Regarding her history of diabetes, she reports that she takes metformin XL 750 twice daily due to previous intolerance of metformin.  She reports only eating about 1 full meal a day at about 3 PM.  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  She will normally eat the 1 meal at 3 PM and maybe a little snack after work.  She reports taking her metformin before the first meal and usually her second dose on an empty stomach, which likely was the cause of her intolerance.  She is also on insulin glargine 68 units every morning and 35 units every afternoon and does not take any mealtime insulin, due to her job.  During this admission, she was started on fenofibrate and fish oil.    Medical History:   Diagnosis Date   ??? Chest pain    ??? Coronary artery disease of native artery of native heart with stable angina pectoris [facility]) 06/12/2018    06/12/2018 - cath with high grade stenosis in the mid left anterior descending artery status post percutaneous coronary intervention with drug-eluting stent x1.  Initiated on aspirin and Brilinta   ??? Diabetes (HCC)    ??? Dyslipidemia, goal LDL below 70 06/12/2018   ??? Edema    ??? HTN (hypertension)    ??? Hyperlipemia    ??? Hypertension 05/29/2018   ??? Syncope and collapse    ??? TIA (transient ischemic attack)    ??? Type 2 diabetes mellitus with ophthalmic complication (HCC) 06/12/2018     Surgical History:   Procedure Laterality Date   ??? ANGIOGRAPHY CORONARY ARTERY WITH LEFT HEART CATHETERIZATION N/A 06/12/2018    Performed by Nat Math, MD at [facility] CATH LAB   ??? POSSIBLE PERCUTANEOUS CORONARY STENT PLACEMENT WITH ANGIOPLASTY N/A 06/12/2018    Performed by Nat Math, MD at [facility] CATH LAB     Both parents had high cholesterol, but patient is unsure of which type of cholesterol   Social History     Socioeconomic History   ??? Marital status: Divorced     Spouse name: Not on file   ??? Number of children: Not on file   ??? Years of education: Not on file   ??? Highest education level: Not on file   Occupational History   ??? Not on file   Social Needs   ??? Financial resource strain: Not on file   ??? Food insecurity     Worry: Not on file     Inability: Not on file   ??? Transportation needs     Medical: Not on file     Non-medical: Not on file   Tobacco Use   ??? Smoking status: Former Smoker     Types: Cigarettes   ??? Smokeless tobacco: Never Used Substance and Sexual Activity   ??? Alcohol use: Not Currently   ??? Drug use: Never   ??? Sexual activity: Not on file   Lifestyle   ???  Physical activity     Days per week: Not on file     Minutes per session: Not on file   ??? Stress: Not on file   Relationships   ??? Social Wellsite geologist on phone: Not on file     Gets together: Not on file     Attends religious service: Not on file     Active member of club or organization: Not on file     Attends meetings of clubs or organizations: Not on file     Relationship status: Not on file   ??? Intimate partner violence     Fear of current or ex partner: Not on file     Emotionally abused: Not on file     Physically abused: Not on file     Forced sexual activity: Not on file   Other Topics Concern   ??? Not on file   Social History Narrative   ??? Not on file            Immunizations (includes history and patient reported):   Immunization History   Administered Date(s) Administered   ??? Pneumococcal Vaccine (23-Val Adult) 06/11/2019           Allergies:  Morphine; Celebrex [celecoxib]; Latex; Penicillins; Sulfa (sulfonamide antibiotics); and Mushroom    Medications:  Medications Prior to Admission   Medication Sig   ??? albuterol 0.083% (PROVENTIL) 2.5 mg /3 mL (0.083 %) nebulizer solution Inhale  solution by nebulizer as directed as Needed.   ??? allopurinol (ZYLOPRIM) 300 mg tablet Take 300 mg by mouth as Needed. Take with food.    ??? aspirin EC 81 mg tablet Take one tablet by mouth daily. Take with food.   ??? cholecalciferol(+) (Vitamin D3) (OPTIMAL D3) 50,000 units capsule Take 50,000 Units by mouth every 7 days.   ??? citalopram (CELEXA) 20 mg tablet Take 20 mg by mouth daily.   ??? furosemide (LASIX) 20 mg tablet Take 20 mg by mouth twice daily. Patient takes 40mg  in the morning and 20mg  in the afternoon   ??? insulin glargine (LANTUS) 100 unit/mL injection Inject  under the skin. inject 68 units in the morning and 35 units at bedtime ??? insulin regular(DIL) (HUMULIN R) 10 units/mL Inject  under the skin three times daily before meals. Take as directed    ??? metFORMIN-XR(+) (GLUCOPHAGE XR) 750 mg extended release tablet Take one tablet by mouth twice daily with meals. Ok to resume on June 15, 2018   ??? metoprolol XL (TOPROL XL) 25 mg extended release tablet Take 25 mg by mouth daily.   ??? nitroglycerin (NITROSTAT) 0.4 mg tablet DISSOLVE ONE TABLET UNDER THE TONGUE EVERY 5 MINUTES AS NEEDED FOR CHEST PAIN.  DO NOT EXCEED A TOTAL OF 3 DOSES IN 15 MINUTES   ??? PROAIR HFA 90 mcg/actuation inhaler Inhale 2 puffs by mouth into the lungs as Needed.   ??? rosuvastatin (CRESTOR) 40 mg tablet Take one tablet by mouth daily.   ??? SKYRIZI 75 mg/0.83 mL syringe Inject 150 mg under the skin every 90 days.   ??? tiotropium (SPIRIVA WITH HANDIHALER) 18 mcg capsule for inhaler Place 18 mcg into inhaler and inhale into lungs as directed daily.     Review of Systems:  A 14 point review of systems was negative except for: Constitutional: positive for fatigue and headache  Respiratory: positive for dyspnea on exertion  Cardiovascular: positive for chest pressure/discomfort, chest pressure that is worse with exertion and  relieved  by rest   Gastrointestinal: positive for abdominal pain    Physical Exam:  Vital Signs: Last Filed In 24 Hours Vital Signs: 24 Hour Range   BP: 129/77 (06/11 1547)  Temp: 36.6 ???C (97.9 ???F) (06/11 1547)  Pulse: 69 (06/11 1547)  Respirations: 18 PER MINUTE (06/11 1547)  SpO2: 96 % (06/11 1547) BP: (110-148)/(65-91)   Temp:  [36.6 ???C (97.8 ???F)-36.8 ???C (98.3 ???F)]   Pulse:  [66-83]   Respirations:  [16 PER MINUTE-18 PER MINUTE]   SpO2:  [92 %-96 %]    Intensity Pain Scale (Self Report): 7 (06/11/19 1232)      General:  Alert, cooperative, no distress, appears stated age  Head:  Normocephalic, without obvious abnormality, atraumatic  Eyes:  Conjunctivae/corneas clear.  PERRL, EOMs intact.  Fundi benign Neck:    Supple, symmetrical, trachea midline, no adenopathy, thyroid: no enlargement/tenderness/nodules, no carotid bruit and no JVD  Lungs:  Clear to auscultation bilaterally  Heart:   Regular rate and rhythm, S1, S2 normal, no murmur, click rub or gallop  Abdomen:  mild, diffuse TPP but more prominent in RUQ. 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  ) (06/11/19 0425)  POC Glucose (Download): (!) 206 (06/11/19 1544)  Pertinent radiology reviewed.    Joan Flores, MD  Pager

## 2019-06-11 NOTE — Progress Notes
Brief Hepatology Progress Note    Patient continues to report some upper abdominal tenderness. No N/V. No BMs. SOB unchanged.    LFTs stable  Hgb stable  Triglycerides not repeated    Recommendations:  -Please send chronic viral hepatitis panel, ANA, ASMA, iron panel, A1AT, AMA, ceruloplasmin for work up of patient's chronic hepatitis/steatosis.  -Further workup and management of hypertriglyceridemia per primary team  -Pain control and anti-emetics per primary team      Discussed with Dr. Gay Filler, MD  GI Fellow  Pager [phone]

## 2019-06-11 NOTE — Progress Notes
General Progress Note    Name:  Traci Wade   ZOXWR'[id number] Date:  06/11/2019  Admission Date: 06/09/2019  LOS: 2 days                     Assessment/Plan:    Principal Problem:    Hypertriglyceridemia    54 year old female with past medical history of CAD s/p PCI, DM-II, HTN/ DLD, and recent diagnosis of Covid 19 PNA last month presented to her PCP's office on 06/09/19 for continued shortness of breath.  Imaging showed improving PNA but labs showed hypertriglyceridemia.  She was then directly admitted to [facility] for further work up.     Hypertriglyceridemia:  - Triglycerides 5558 on lipid panel   - lipase negative and no signs of pancreatitis on CT  - continue home home rosuvastatin 40mg  daily  - started on finofibrate 145mg  daily  - continue fish oil  - nutrition consulted about diet recs  - endocrinology consulted     Hepatomegaly/ Hepatic Steatosis:  - CT A/P with moderate hepatomegaly and diffuse hepatic steatosis  - Hepatology consulted    - check viral hep panel, ANA, AMA, ASMA, alpha-1 antitrypsin, ceruloplasmin, iron panel    Recent Covid PNA:   - Per report, CT done by PCP on 6/8 with improving b/l PNA  - repeat Covid 19 test negative    CAD/ HTN:  - s/p DES to the LAD in 05/2018  - continue PTA ASA, rosuvastatin, and metoprolol    DM-II:  - HbA1c 9.4  - continue Lantus to 70U QAM and 40U QPM  - continue to moderate dose correction factor  - endocrinology consulted      Other:  - continue PTA citalopram     FEN:  - diabetic diet    Ppx:  - Lovneox sq     FULL CODE     Eloise Levels, MD  [facility]  Pager: [phone]      ________________________________________________________________________    Subjective  Traci Wade is a 54 y.o. female.  No acute events overnight.  Patient still reports some shortness of breath and low energy but no new acute changes.  Still with some RUQ pain.      Medications  Scheduled Meds:aspirin EC tablet 81 mg, 81 mg, Oral, QDAY citalopram (CeleXA) tablet 20 mg, 20 mg, Oral, QDAY  enoxaparin (LOVENOX) syringe 40 mg, 40 mg, Subcutaneous, QDAY(21)  fenofibrate nanocrystallized (TRICOR) tablet 145 mg, 145 mg, Oral, QDAY  fish oil- omega 3-DHA/EPA capsule 1,000 mg, 1,000 mg, Oral, BID w/meals  insulin aspart U-100 (NOVOLOG FLEXPEN) injection PEN 0-12 Units, 0-12 Units, Subcutaneous, ACHS (22)  insulin glargine (LANTUS SOLOSTAR) injection PEN 40 Units, 40 Units, Subcutaneous, QHS(22)  insulin glargine (LANTUS SOLOSTAR) injection PEN 70 Units, 70 Units, Subcutaneous, QDAY  metoprolol XL (TOPROL XL) tablet 25 mg, 25 mg, Oral, QDAY  rosuvastatin (CRESTOR) tablet 40 mg, 40 mg, Oral, QHS    Continuous Infusions:  PRN and Respiratory Meds:acetaminophen Q6H PRN, bisacodyL QDAY PRN, melatonin QHS PRN, ondansetron Q6H PRN **OR** ondansetron (ZOFRAN) IV Q6H PRN, polyethylene glycol 3350 QDAY PRN    Objective:                          Vital Signs: Last Filed                 Vital Signs: 24 Hour Range   BP: 128/65 (06/11 1120)  Temp:  36.6 ???C (97.8 ???F) (06/11 1120)  Pulse: 71 (06/11 1120)  Respirations: 18 PER MINUTE (06/11 1120)  SpO2: 95 % (06/11 1120) BP: (110-148)/(65-91)   Temp:  [36.6 ???C (97.8 ???F)-36.9 ???C (98.4 ???F)]   Pulse:  [66-83]   Respirations:  [16 PER MINUTE-18 PER MINUTE]   SpO2:  [92 %-95 %]    Intensity Pain Scale (Self Report): 7 (06/11/19 1232) Vitals:    06/09/19 1441 06/09/19 2200   Weight: 82.9 kg (182 lb 12.2 oz) 82.3 kg (181 lb 6.4 oz)       Intake/Output Summary:  (Last 24 hours)    Intake/Output Summary (Last 24 hours) at 06/11/2019 1439  Last data filed at 06/11/2019 1100  Gross per 24 hour   Intake 1030 ml   Output 1700 ml   Net -670 ml      Stool Occurrence: 1    Physical Exam  Gen: NAD  CV: RRR, no m/r/g  Pulm: CTAB.  Breathing okay off supplemental oxygen  GI: soft, mild tenderness to palpation in RUQ   Ext: no pitting edema     Lab Review  24-hour labs:    Results for orders placed or performed during the hospital encounter of 06/09/19 (from the past 24 hour(s))   POC GLUCOSE    Collection Time: 06/10/19  5:41 PM   Result Value Ref Range    Glucose, POC 286 (H) 70 - 100 MG/DL   POC GLUCOSE    Collection Time: 06/10/19 10:01 PM   Result Value Ref Range    Glucose, POC 325 (H) 70 - 100 MG/DL   CBC    Collection Time: 06/11/19  4:25 AM   Result Value Ref Range    White Blood Cells 7.4 4.5 - 11.0 K/UL    RBC 3.86 (L) 4.0 - 5.0 M/UL    Hemoglobin 10.8 (L) 12.0 - 15.0 GM/DL    Hematocrit 16.1 (L) 36 - 45 %    MCV 82.8 80 - 100 FL    MCH 27.9 26 - 34 PG    MCHC 33.8 32.0 - 36.0 G/DL    RDW 09.6 11 - 15 %    Platelet Count 329 150 - 400 K/UL    MPV 7.4 7 - 11 FL   COMPREHENSIVE METABOLIC PANEL    Collection Time: 06/11/19  4:25 AM   Result Value Ref Range    Sodium 131 (L) 137 - 147 MMOL/L    Potassium 4.2 3.5 - 5.1 MMOL/L    Chloride 95 (L) 98 - 110 MMOL/L    Glucose 215 (H) 70 - 100 MG/DL    Blood Urea Nitrogen 12 7 - 25 MG/DL    Creatinine 0.45 0.4 - 1.00 MG/DL    Calcium 9.3 8.5 - 40.9 MG/DL    Total Protein 6.7 6.0 - 8.0 G/DL    Total Bilirubin 0.7 0.3 - 1.2 MG/DL    Albumin 3.5 3.5 - 5.0 G/DL    Alk Phosphatase 85 25 - 110 U/L    AST (SGOT) 45 (H) 7 - 40 U/L    CO2 21 21 - 30 MMOL/L    ALT (SGPT) 31 7 - 56 U/L    Anion Gap 15 (H) 3 - 12    eGFR Non African American >60 >60 mL/min    eGFR African American >60 >60 mL/min   POC GLUCOSE    Collection Time: 06/11/19  8:18 AM   Result Value Ref Range    Glucose, POC 288 (  H) 70 - 100 MG/DL   POC GLUCOSE    Collection Time: 06/11/19 12:41 PM   Result Value Ref Range    Glucose, POC 340 (H) 70 - 100 MG/DL       Point of Care Testing  (Last 24 hours)  Glucose: (!) 215(Due to high levels of Lipemia, testing has been performed on an ultrafuged   sample.  ) (06/11/19 0425)  POC Glucose (Download): (!) 340 (06/11/19 1241)

## 2019-06-12 ENCOUNTER — Encounter: Admit: 2019-06-12 | Discharge: 2019-06-12

## 2019-06-12 ENCOUNTER — Encounter: Admit: 2019-06-09 | Discharge: 2019-06-09

## 2019-06-12 DIAGNOSIS — J129 Viral pneumonia, unspecified: Secondary | ICD-10-CM

## 2019-06-12 DIAGNOSIS — Z794 Long term (current) use of insulin: Secondary | ICD-10-CM

## 2019-06-12 DIAGNOSIS — Z87891 Personal history of nicotine dependence: Secondary | ICD-10-CM

## 2019-06-12 DIAGNOSIS — Z86010 Personal history of colonic polyps: Secondary | ICD-10-CM

## 2019-06-12 DIAGNOSIS — K760 Fatty (change of) liver, not elsewhere classified: Secondary | ICD-10-CM

## 2019-06-12 DIAGNOSIS — I2510 Atherosclerotic heart disease of native coronary artery without angina pectoris: Secondary | ICD-10-CM

## 2019-06-12 DIAGNOSIS — E781 Pure hyperglyceridemia: Secondary | ICD-10-CM

## 2019-06-12 DIAGNOSIS — Z9049 Acquired absence of other specified parts of digestive tract: Secondary | ICD-10-CM

## 2019-06-12 DIAGNOSIS — E119 Type 2 diabetes mellitus without complications: Secondary | ICD-10-CM

## 2019-06-12 DIAGNOSIS — Z8701 Personal history of pneumonia (recurrent): Secondary | ICD-10-CM

## 2019-06-12 DIAGNOSIS — Z1159 Encounter for screening for other viral diseases: Secondary | ICD-10-CM

## 2019-06-12 DIAGNOSIS — Z955 Presence of coronary angioplasty implant and graft: Secondary | ICD-10-CM

## 2019-06-12 DIAGNOSIS — E785 Hyperlipidemia, unspecified: Secondary | ICD-10-CM

## 2019-06-12 DIAGNOSIS — I10 Essential (primary) hypertension: Secondary | ICD-10-CM

## 2019-06-12 DIAGNOSIS — Z8673 Personal history of transient ischemic attack (TIA), and cerebral infarction without residual deficits: Secondary | ICD-10-CM

## 2019-06-12 LAB — HEPATITIS B CORE AB TOT (IGG+IGM)

## 2019-06-12 LAB — HEPATITIS C ANTIBODY W REFLEX HCV PCR QUANT

## 2019-06-12 LAB — COMPREHENSIVE METABOLIC PANEL
Lab: 136 MMOL/L — ABNORMAL LOW (ref 137–147)
Lab: 99 MMOL/L (ref >60)

## 2019-06-12 LAB — POC GLUCOSE
Lab: 291 mg/dL — ABNORMAL HIGH (ref 70–100)
Lab: 186 mg/dL — ABNORMAL HIGH (ref 70–100)
Lab: 291 mg/dL — ABNORMAL HIGH (ref 70–100)

## 2019-06-12 LAB — LIPID PROFILE
Lab: 252 mg/dL — ABNORMAL HIGH (ref <150)
Lab: 472 mg/dL — ABNORMAL HIGH (ref <200)

## 2019-06-12 LAB — HEPATITIS A IGM

## 2019-06-12 LAB — ANTI-NUCLEAR ANTIBODY(ANA)

## 2019-06-12 LAB — CBC
Lab: 4 M/UL — ABNORMAL LOW (ref >60)
Lab: 7.7 K/UL (ref >60)

## 2019-06-12 LAB — ANTI-MITOCHONDRIAL ANTIBODY: Lab: <20 {titer} — ABNORMAL LOW (ref <20)

## 2019-06-12 LAB — ANTI-SMOOTH MUSCLE AB: Lab: <20 {titer} (ref <20)

## 2019-06-12 LAB — HEPATITIS B SURFACE AB: Lab: POSITIVE — AB

## 2019-06-12 LAB — HEPATITIS B SURFACE AG

## 2019-06-12 LAB — ANTI-NUCLEAR AB(ANA)-QUANT: Lab: 80 — ABNORMAL HIGH (ref <80)

## 2019-06-12 MED ORDER — FENOFIBRATE NANOCRYSTALLIZED 145 MG PO TAB
145 mg | ORAL_TABLET | Freq: Every day | ORAL | 3 refills | 30.00000 days | Status: AC
Start: 2019-06-12 — End: ?
  Filled 2019-06-12: qty 90, 30d supply, fill #1

## 2019-06-12 MED ORDER — OMEGA 3-DHA-EPA-FISH OIL 300-1,000 MG PO CPDR
2000 mg | ORAL_CAPSULE | Freq: Two times a day (BID) | ORAL | 3 refills | Status: CN
Start: 2019-06-12 — End: ?

## 2019-06-12 MED ORDER — TRAZODONE 50 MG PO TAB
25 mg | Freq: Once | ORAL | 0 refills | Status: CP
Start: 2019-06-12 — End: ?
  Administered 2019-06-12: 06:00:00 25 mg via ORAL

## 2019-06-12 MED ORDER — METFORMIN 500 MG PO TAB
ORAL_TABLET | Freq: Two times a day (BID) | 3 refills | Status: AC
Start: 2019-06-12 — End: ?
  Filled 2019-06-12: qty 180, 30d supply, fill #1

## 2019-06-12 NOTE — Discharge Instructions - Pharmacy
Physician Discharge Summary      Name: Traci Wade  Medical Record Number: [id number]        Account Number:  [id number]  Date Of Birth:  [birth year]                         Age:  54 years   Admit date:  06/09/2019                     Discharge date:  06/12/2019    Attending Physician:  Eloise Levels, MD               Service: Med Private L- [phone]    Physician Summary completed by: Jenkins Rouge, MD     Reason for hospitalization: Hypertriglyceridemia     Allergies: Morphine; Celebrex [celecoxib]; Latex; Penicillins; Sulfa (sulfonamide antibiotics); and Mushroom    Admission Lab/Radiology studies notable for:   CT A/P: moderate hepatomegaly and diffuse hepatic steatosis    Anti-Mitochondrial Ab: nml  Anti-Smooth Muscle Ab: nml  ANA: 80  Hepatitis Panel: Positive Anti HBs  Ferritin: 18    Triglycerides on 6/9: 5558  Triglycerides on 6/12: 2522      Brief Hospital Course:    54 year old female with past medical history of CAD s/p PCI, DM-II, HTN/ DLD, and recent diagnosis of Covid 19 PNA last month presented to her PCP's office on 06/09/19 for continued shortness of breath.  Imaging showed improving PNA but labs showed hypertriglyceridemia.  She was then directly admitted to [facility] for further work up.     On admission, her triglycerides were elevated to 5558.  CT scan showed diffuse hepatic steatosis.  She was continued on her home rosuvastatin and fenofibrate and fish oil were started. Hepatology was consulted and sent chronic viral hepatitis panel, ANA, ASMA, iron panel, A1AT, AMA, ceruloplasmin for work up of patient's chronic hepatitis/steatosis.  They recommended an endocrinology consult for further treatment of uncontrolled DM-II and hypertriglyceridemia.  Endocrinology started the patient on metformin.  Per endocrine, they plan to follow up with patient as an outpatient with a lipid panel within a week and potentially start Victozia at that time if patient is able to tolerate GI side effects.  She was discharged home on 06/12/19.  She will follow up closely with her PCP and endocrinology.  Triglycerides at time of discharge have trended down to 2522.      Condition at Discharge: Stable    Discharge Diagnoses:  Hepatic Steatosis.     Hospital Problems        Active Problems    * (Principal) Hypertriglyceridemia          Surgical Procedures: None    Significant Diagnostic Studies and Procedures: noted in brief hospital course    Consults:  Endocrinology and Hepatology    Patient Disposition: Home       Patient instructions/medications:       Low Fat / Low Cholesterol Diet    Your goal is to limit the amount of saturated and trans fats in your diet. Keep track of how much cholesterol you eat and limit the total amount to 200mg  (milligrams) a day.      If you have questions about your diet after you go home, you can call a dietitian at [phone].       Diabetic Diet    You should eat between 1600 and 2000 calories per day.  This is equal to  60g (grams) of carbohydrates per meal, and 30g of carbohydrates for a bedtime snack.    If you have questions about your diet after you go home, you can call a dietitian at [phone].     Report These Signs and Symptoms    Please contact your doctor if you have any of the following symptoms: temperature higher than 100.4 degrees F, uncontrolled pain, persistent nausea and/or vomiting, difficulty breathing or chest pain     Questions About Your Stay    For questions or concerns regarding your hospital stay, call [phone].     Discharging attending physician: Jenkins Rouge [[id number]]      Activity as Tolerated    It is important to keep increasing your activity level after you leave the hospital.  Moving around can help prevent blood clots, lung infection (pneumonia) and other problems.  Gradually increasing the number of times you are up moving around will help you return to your normal activity level more quickly.  Continue to increase the number of times you are up to the chair and walking daily to return to your normal activity level. Begin to work toward your normal activity level at discharge     Appointment Request: Hepatology    Needs 6 mo gen hep Talaat. See in clinic.     Diagnosis: Fatty liver    Was the patient seen in hospital by the Hepatology consult service? Yes    Is patient established in the Hepatology clinic? No    Does the patient already have an appointment scheduled with Hepatology clinic? (If requesting sooner appt please indicate reasoning.) No    Is this an Urgent Request needing sooner than (4 weeks follow up)? No    Pager # of the requesting provider if any questions? Number not on file       Current Discharge Medication List       START taking these medications    Details   fenofibrate nanocrystallized (TRICOR) 145 mg tablet Take one tablet by mouth daily. Take with food.  Qty: 90 tablet, Refills: 3    PRESCRIPTION TYPE:  Normal      fish oil- omega 3-DHA/EPA 300/1,000 mg capsule Take two capsules by mouth twice daily with meals.  Qty: 180 capsule, Refills: 3    PRESCRIPTION TYPE:  Normal      metFORMIN (GLUCOPHAGE) 500 mg tablet Take 1 tablet twice daily for one week, then increase to 2 tablets twice daily thereafter. Take at the end of meals on a full stomach  Qty: 180 tablet, Refills: 3    PRESCRIPTION TYPE:  Normal          CONTINUE these medications which have NOT CHANGED    Details   albuterol 0.083% (PROVENTIL) 2.5 mg /3 mL (0.083 %) nebulizer solution Inhale  solution by nebulizer as directed as Needed.    PRESCRIPTION TYPE:  Historical Med      allopurinol (ZYLOPRIM) 300 mg tablet Take 300 mg by mouth as Needed. Take with food.     PRESCRIPTION TYPE:  Historical Med      aspirin EC 81 mg tablet Take one tablet by mouth daily. Take with food.  Qty: 30 tablet, Refills: 5    PRESCRIPTION TYPE:  No Print      cholecalciferol(+) (Vitamin D3) (OPTIMAL D3) 50,000 units capsule Take 50,000 Units by mouth every 7 days.  Refills: 3 PRESCRIPTION TYPE:  Historical Med      citalopram (CELEXA) 20 mg tablet Take 20  mg by mouth daily.    PRESCRIPTION TYPE:  Historical Med      furosemide (LASIX) 20 mg tablet Take 20 mg by mouth twice daily. Patient takes 40mg  in the morning and 20mg  in the afternoon    PRESCRIPTION TYPE:  Historical Med      insulin glargine (LANTUS) 100 unit/mL injection Inject  under the skin. inject 68 units in the morning and 35 units at bedtime    PRESCRIPTION TYPE:  Historical Med      insulin regular(DIL) (HUMULIN R) 10 units/mL Inject  under the skin three times daily before meals. Take as directed     PRESCRIPTION TYPE:  Historical Med      metoprolol XL (TOPROL XL) 25 mg extended release tablet Take 25 mg by mouth daily.  Refills: 3    PRESCRIPTION TYPE:  Historical Med      nitroglycerin (NITROSTAT) 0.4 mg tablet DISSOLVE ONE TABLET UNDER THE TONGUE EVERY 5 MINUTES AS NEEDED FOR CHEST PAIN.  DO NOT EXCEED A TOTAL OF 3 DOSES IN 15 MINUTES  Qty: 25 tablet, Refills: 0    PRESCRIPTION TYPE:  Normal  Comments: Please consider 90 day supplies to promote better adherence      PROAIR HFA 90 mcg/actuation inhaler Inhale 2 puffs by mouth into the lungs as Needed.  Refills: 0    PRESCRIPTION TYPE:  Historical Med      rosuvastatin (CRESTOR) 40 mg tablet Take one tablet by mouth daily.  Qty: 90 tablet, Refills: 3    PRESCRIPTION TYPE:  Normal      SKYRIZI 75 mg/0.83 mL syringe Inject 150 mg under the skin every 90 days.    PRESCRIPTION TYPE:  Historical Med      tiotropium (SPIRIVA WITH HANDIHALER) 18 mcg capsule for inhaler Place 18 mcg into inhaler and inhale into lungs as directed daily.    PRESCRIPTION TYPE:  Historical Med          The following medications were removed from your list. This list includes medications discontinued this stay and those removed from your prior med list in our system        metFORMIN-XR(+) (GLUCOPHAGE XR) 750 mg extended release tablet Pending items needing follow up: Endocrinology ordered a lipid panel in one week.  They will follow up and adjust medication accordingly.     Signed:  Jenkins Rouge, MD  06/12/2019      cc:  Primary Care Physician:  Rockwell Germany   Verified  Referring physicians:  Gardiner Barefoot, ARNP   Additional provider(s):

## 2019-06-12 NOTE — Progress Notes
Day of Discharge Note    Day of discharge progress note for Traci Wade    Chart data reviewed including medications, consultation notes, lab, vitals, imaging.  Patient was seen and examined with pertinent information listed below.    Subjective: No acute events overnight.  Patient states she still doesn't feel great but no worse than admission.  Mostly complains of feeling tired/ weak.  She feels she is ready to go home.      Exam:   Gen: NAD  CV: RRR  Pulm: CTAB  Abd: soft    Discharge plans and pertinent follow up items after discharge: Patient to follow up with endocrinology.  Lipid panel to be drawn in one week (endocrinology to follow up result)     Patient feels comfortable with plans for discharge.  All questions were answered.  Discharge discussion, including follow up/discharge instructions, occurred with patient face-to-face.      Traci Leo, MD  06/12/2019    Discharge Planning: greater than 30 minutes spent in pt dc care today spent counseling pt, coordinating dc care, placing dc orders and helping complete dc summary.

## 2019-06-12 NOTE — Progress Notes
General Progress Note      Admission Date: 06/09/2019  LOS: 3 days                     Assessment/Plan:    Principal Problem:    Hypertriglyceridemia    54 yo F W/PMH CAD s/p PCI (June 2019), DM II, HTN, dyslipidemia, COVID-19 positive pneumonia last month who was admitted for further work-up of hypertriglyceridemia.   ???  #Hypertriglyceridemia   #HLD   - Lipid panel 12/25/18: chol 351; trigly 397; HDL 49; LDL 217; VLDL 79  - Lipid panel 06/09/19: chol 609; trigly 5558; HDL 30; LDL 118; VLDL 1112  - Lipid panel 06/11/19: chol 507; trigly 3052; HDL 25; LDL 76; VLDL 610  - Lipid panel 06/12/19: chol 472; trigly 2522; HDL 28; LDL 73; VLDL 504  - patient placed on fish oil 1g BID and fenofibrate 145mg  qday this admission   - patient on PTA rosuvastatin 40mg  qday   Plan:   > continue fish oil 2g BID   > continue fenofibrate 145mg  qday   > continue rosuvastatin 40mg  qday   > patient will need repeat lipid panel 1 week post d/c to re-evaluate   ???  ???  #DM II   - patient on PTA insulin glargine 68u QAM and 35u QPM   - patient on PTA metformin XL 750 BID. Could not tolerate metformin 1000 BID, but she was taking her first dose with her one full meal of the day around 1500. She does not eat very much for dinner (typically a snack) and will take her second dose of metformin at that time   - does not take meal time insulin d/t her work hours   - hgb A1C (06/09/19) 9.4%    Plan:   - continue to up-titrate metformin to either:                - 1000 BID                -- OR --                - 1500 QAM and 750 QPM.   I have instructed Traci Wade to take these medications on a full stomach, at the very end of her meals.   - once we have up-titrated the metformin to either of the above regimens, she can start victoza 1.2mg  subq qday  - resume home regimen of insulin glargine 68u QAM and 35u QHS     Patient discussed with Dr. Leandro Reasoner, MD   PGY-1 ________________________________________________________________________    Subjective  No acute events overnight.     Traci Wade is still having abdominal cramping/pain this morning, but states that it is tolerable. She took her metformin this morning, prior to breakfast, so we discussed that she should take her metformin at the very end of her meals. We are planning to up-titrate her metformin prior to starting Victoza, which may precipitate GI side effects of metformin. Otherwise, no new issues at this time. Triglycerides are down to 2522 this AM. NCM was able to get prior auth for Victoza, but not for concentrated fish oil.     Review of Systems   Constitutional: Negative for chills and fever.   Respiratory: Negative for shortness of breath.    Cardiovascular: Negative for chest pain and leg swelling.   Gastrointestinal: Positive for abdominal pain. Negative for constipation, diarrhea, nausea and vomiting.  Medications  Scheduled Meds:aspirin EC tablet 81 mg, 81 mg, Oral, QDAY  citalopram (CeleXA) tablet 20 mg, 20 mg, Oral, QDAY  enoxaparin (LOVENOX) syringe 40 mg, 40 mg, Subcutaneous, QDAY(21)  fenofibrate nanocrystallized (TRICOR) tablet 145 mg, 145 mg, Oral, QDAY  fish oil- omega 3-DHA/EPA capsule 2,000 mg, 2,000 mg, Oral, BID w/meals  insulin aspart U-100 (NOVOLOG FLEXPEN) injection PEN 0-12 Units, 0-12 Units, Subcutaneous, ACHS (22)  insulin glargine (LANTUS SOLOSTAR) injection PEN 40 Units, 40 Units, Subcutaneous, QHS(22)  insulin glargine (LANTUS SOLOSTAR) injection PEN 70 Units, 70 Units, Subcutaneous, QDAY  metFORMIN (GLUCOPHAGE) tablet 500 mg, 500 mg, Oral, BID w/meals  metoprolol XL (TOPROL XL) tablet 25 mg, 25 mg, Oral, QDAY  rosuvastatin (CRESTOR) tablet 40 mg, 40 mg, Oral, QHS    Continuous Infusions:  PRN and Respiratory Meds:acetaminophen Q6H PRN, bisacodyL QDAY PRN, melatonin QHS PRN, ondansetron Q6H PRN **OR** ondansetron (ZOFRAN) IV Q6H PRN, polyethylene glycol 3350 QDAY PRN Objective                       Vital Signs: Last Filed                 Vital Signs: 24 Hour Range   BP: 115/64 (06/12 0806)  Temp: 36.3 ???C (97.4 ???F) (06/12 1610)  Pulse: 79 (06/12 0806)  Respirations: 18 PER MINUTE (06/12 0806)  SpO2: 94 % (06/12 0806) BP: (107-130)/(64-81)   Temp:  [36.3 ???C (97.4 ???F)-36.8 ???C (98.2 ???F)]   Pulse:  [69-81]   Respirations:  [16 PER MINUTE-18 PER MINUTE]   SpO2:  [92 %-98 %]    Intensity Pain Scale (Self Report): 6 (06/12/19 9604) Vitals:    06/09/19 1441 06/09/19 2200   Weight: 82.9 kg (182 lb 12.2 oz) 82.3 kg (181 lb 6.4 oz)       Intake/Output Summary:  (Last 24 hours)    Intake/Output Summary (Last 24 hours) at 06/12/2019 0948  Last data filed at 06/12/2019 0827  Gross per 24 hour   Intake 520 ml   Output 3325 ml   Net -2805 ml      Stool Occurrence: 1    Physical Exam  General appearance: alert, well-nourished, cooperative, no distress and mildly obese  Neurologic: Grossly normal  Alert and oriented X 3, normal strength and tone. Normal symmetric reflexes. Normal coordination and gait  Lungs: clear to auscultation bilaterally  Heart: regular rate and rhythm, S1, S2 normal, no murmur, click, rub or gallop  Abdomen: mild, diffuse abdominal cramping/pain. active bowel sounds, nondistednded  Extremities: extremities normal, atraumatic, no cyanosis or edema     Lab Review  CBC w/Diff    Lab Results   Component Value Date/Time    WBC 7.7 06/12/2019 04:44 AM    RBC 4.04 06/12/2019 04:44 AM    HGB 11.5 (L) 06/12/2019 04:44 AM    HCT 33.2 (L) 06/12/2019 04:44 AM    MCV 82.2 06/12/2019 04:44 AM    MCH 28.5 06/12/2019 04:44 AM    MCHC 34.7 06/12/2019 04:44 AM    RDW 14.2 06/12/2019 04:44 AM    PLTCT 286 06/12/2019 04:44 AM    MPV 6.8 (L) 06/12/2019 04:44 AM    No results found for: NEUT, ANC, LYMA, ALC, MONA, AMC, EOSA, AEC, BASA, ABC     Comprehensive Metabolic Profile    Lab Results   Component Value Date/Time    NA 136 (L) 06/12/2019 04:44 AM    K 3.7 06/12/2019 04:44 AM CL 99  06/12/2019 04:44 AM    CO2 23 06/12/2019 04:44 AM    GAP 14 (H) 06/12/2019 04:44 AM    BUN 10 06/12/2019 04:44 AM    CR 0.75 06/12/2019 04:44 AM    GLU 145 (H) 06/12/2019 04:44 AM    Lab Results   Component Value Date/Time    CA 9.4 06/12/2019 04:44 AM    ALBUMIN 3.6 06/12/2019 04:44 AM    TOTPROT 6.3 06/12/2019 04:44 AM    ALKPHOS 80 06/12/2019 04:44 AM    AST 36 06/12/2019 04:44 AM    ALT 30 06/12/2019 04:44 AM    TOTBILI 0.4 06/12/2019 04:44 AM    GFR >60 06/12/2019 04:44 AM    GFRAA >60 06/12/2019 04:44 AM          Point of Care Testing  (Last 24 hours)  FSBS (Manual): (!) 325  Glucose: (!) 145  POC Glucose (Download): (!) 186    Radiology and other Diagnostics Review:  Pertinent radiology reviewed     Joan Flores, MD   Pg [phone]

## 2019-06-12 NOTE — Care Plan
Pt is up-to-date regarding the plan of care.  Endo and hepatology have been consulted.  Labs pending.  Pt will follow-up as an outpatient.  Pt is stable for discharge to home today.  Questions/concerns addressed.    Problem: Infection, Risk of  Goal: Knowledge of Infection Control Procedures  Outcome: Goal Achieved.  Pt is COVID-19 negative.  No additional isolation precautions have been needed.

## 2019-06-12 NOTE — Case Management (ED)
Case Management Progress Note    NAME:Traci Wade                          MRN: [id number]              DOB:1965/04/05          AGE: 54 y.o.  ADMISSION DATE: 06/09/2019             DAYS ADMITTED: LOS: 3 days      Todays Date: 06/12/2019    Plan  D/c today   Pt will d/c on her metformin today and will not need the victoza. Pt will f/u with endocrine outpt. ncm obtained PA yesterday for this that will be in effect for a full year.    Pt will have her son provide transport home to [place].    No further case management needs identified for d/c.     Interventions  ? Support   Support: Pt/Family Updates re:POC or DC Plan  ? Info or Referral   Information or Referral to Community Resources: No Needs Identified  ? Discharge Planning   Discharge Planning: No Needs Identified  ? Medication Needs   Medication Needs: Co-Pay Check(done for endocrine team yesterday. see note)           ? Financial   Financial: No Needs Identified  ? Legal   Legal: No Needs Identified  ? Other   Other/None: No needs identified    Disposition  ? Expected Discharge Date    Expected Discharge Date: 06/12/19  Expected Discharge Time: 1500  ? Transportation   Does the patient need discharge transport arranged?: No  Transportation Name, Phone and Availability #1: pts adult son will provide transport at time of d/c and he is off till [place]. he will be coming from [place].   Does the patient use Medicaid Transportation?: No  ? Next Level of Care (Acute Psych discharges only)      ? Discharge Disposition                                          Durable Medical Equipment      No service has been selected for the patient.      [place] Destination      No service has been selected for the patient.      [place]      No service has been selected for the patient.      [facility]      No service has been selected for the patient.      [place] Nurse Case Manager Clyde Rn-ACM  Ph [phone]  Pager [phone].cmp

## 2019-06-12 NOTE — Progress Notes
Traci Wade discharged on 06/12/2019.  Discharge instructions reviewed with patient.  Valuables returned: yes  Home medications: n/a   Functional assessment at discharge complete: Yes .    Discharge orders acknowledged.  Discharge instructions reviewed with pt, questions/concerns addressed.  IV and telemetry discontinued.  Pt dressed and packed up personal belongings without assistance.  Pt ambulated off of unit in no apparent distress with instructions to pick up medications from pharmacy prior to leaving hospital.

## 2019-06-12 NOTE — Progress Notes
Brief Hepatology Progress Note  LFTs stable  Hgb stable  Triglycerides improving    Iron panel with low iron sat  ANA 80, ASMA <20, AMA <20  Hep B immune, Hep A/C non-reactive    Recommendations:  -Hepatic steatosis and mild elevation in AST is likely secondary to NAFLD  -Recommend follow up with hepatology clinic in 6 months for further evaluation and risk assessment for advanced fibrosis.  -Further management of hypertriglyceridemia per primary team    Hepatology will sign off. Please page on call fellow with any additional questions or concerns      Discussed with Dr. Gay Filler, MD  GI Fellow  Pager [phone]

## 2019-06-15 ENCOUNTER — Encounter: Admit: 2019-06-15 | Discharge: 2019-06-15

## 2019-06-15 DIAGNOSIS — R69 Illness, unspecified: Secondary | ICD-10-CM

## 2019-06-17 LAB — ALPHA 1 ANTITRYPSIN PHENOTYPE: Lab: 110 mg/dL — ABNORMAL LOW (ref >40)

## 2019-06-17 LAB — CERULOPLASMIN: Lab: 11 mg/dL — ABNORMAL LOW (ref 20.0–60.0)

## 2019-06-18 ENCOUNTER — Encounter: Admit: 2019-06-18 | Discharge: 2019-06-18

## 2019-06-22 ENCOUNTER — Encounter: Admit: 2019-06-22 | Discharge: 2019-06-22

## 2019-06-22 DIAGNOSIS — E785 Hyperlipidemia, unspecified: Secondary | ICD-10-CM

## 2019-06-22 DIAGNOSIS — E781 Pure hyperglyceridemia: Secondary | ICD-10-CM

## 2019-06-22 LAB — LIPID PROFILE
Lab: 143
Lab: 245
Lab: 36
Lab: 66
Lab: 7
Lab: 717

## 2019-06-22 NOTE — Progress Notes
In patient only - Future appt with Dr. Lennox Grumbles.  Future Appointments   Date Time Provider [place]   09/09/2019 11:30 AM Caffie Pinto, MD [facility] IM   12/07/2019  4:10 PM Talaat, Lorne Skeens, MD [facility] HEP CFT [place] document to be scanned when back on campus.

## 2019-06-22 NOTE — Telephone Encounter
Called patient regarding labs.  She was fasting for labs, at least 12 hours.   triglycerides still elevated at 717    She is taking:  Fenofirbate 145mg  daily   Fish oil: 2000mg  twice daily   metformin 500mg  twice daily   rosuvastatin 40mg  once daily     Blood sugars: this morning 186    She has no concerns at this time.

## 2019-06-24 NOTE — Telephone Encounter
Spoke with Dr. Lennox Grumbles.     Called patient and relayed the following:  continue all medications as listed, work up to either 4 500mg  tablets of metformin or 3 750mg  tablets of metformin daily.     Focus on low fat diet as much as possible, patient states that this has been difficult.   She also notes some difficulty with the fish oil, again encouraged her to take this as much as possible.     Advised patient to reach out if she is having any issues with the above or side effects so that we may help.     Will plan for repeat lipid profile in 1 month, will ask my nurse to mail lab order to patient's home.

## 2019-06-25 NOTE — Telephone Encounter
Mailed lab order as requested.

## 2019-08-26 ENCOUNTER — Encounter: Admit: 2019-08-26 | Discharge: 2019-08-26

## 2019-09-09 ENCOUNTER — Encounter: Admit: 2019-09-09 | Discharge: 2019-09-09

## 2019-09-09 ENCOUNTER — Ambulatory Visit: Admit: 2019-09-09 | Discharge: 2019-09-10

## 2019-09-09 DIAGNOSIS — G459 Transient cerebral ischemic attack, unspecified: Secondary | ICD-10-CM

## 2019-09-09 DIAGNOSIS — R079 Chest pain, unspecified: Secondary | ICD-10-CM

## 2019-09-09 DIAGNOSIS — E119 Type 2 diabetes mellitus without complications: Secondary | ICD-10-CM

## 2019-09-09 DIAGNOSIS — E1139 Type 2 diabetes mellitus with other diabetic ophthalmic complication: Secondary | ICD-10-CM

## 2019-09-09 DIAGNOSIS — I25118 Atherosclerotic heart disease of native coronary artery with other forms of angina pectoris: Secondary | ICD-10-CM

## 2019-09-09 DIAGNOSIS — R55 Syncope and collapse: Secondary | ICD-10-CM

## 2019-09-09 DIAGNOSIS — E782 Mixed hyperlipidemia: Secondary | ICD-10-CM

## 2019-09-09 DIAGNOSIS — E785 Hyperlipidemia, unspecified: Secondary | ICD-10-CM

## 2019-09-09 DIAGNOSIS — R609 Edema, unspecified: Secondary | ICD-10-CM

## 2019-09-09 DIAGNOSIS — I10 Essential (primary) hypertension: Secondary | ICD-10-CM

## 2019-09-09 MED ORDER — VASCEPA 1 GRAM PO CAP
2 g | ORAL_CAPSULE | Freq: Two times a day (BID) | ORAL | 3 refills | Status: AC
Start: 2019-09-09 — End: ?

## 2019-09-09 MED ORDER — SEMAGLUTIDE 0.25 MG OR 0.5 MG(2 MG/1.5 ML) SC PNIJ
3 refills | Status: AC
Start: 2019-09-09 — End: ?

## 2019-09-09 NOTE — Progress Notes
Date of Service: 09/09/2019    Subjective:             Traci Wade is a 54 y.o. female.    History of Present Illness    We saw Traci Wade during a June hospitalization.  She was admitted with severe hypertriglyceridemia.  She has poorly controlled type 2 diabetes; her admission hemoglobin A1c was over 9.0%.  Today's visit was by telephone, as she does not have the capability to do a telehealth visit.    She is feeling generally well at present.  She is taking metformin 1000 mg twice daily, and having diarrhea perhaps once weekly.  She takes it right at the end of a 10:30 AM snack and again at the end of a bedtime snack.  Her main and only meal is a 2:30 PM lunch.  Her insulin regimen consists of glargine 75 am and 40 5 PM.  She is having no symptomatic hypoglycemia at present.  She does not think her blood glucose control is improved.    For dyslipidemia, she takes rosuvastatin 40 mg daily, fenofibrate and generic fish oil, 2 capsules twice daily.  She has an unpleasant fishy burp as a result.  She has an upcoming appointment with her primary care physician next month and expects her lipids to be checked at that time.       Review of Systems   Constitutional:        Mastitis with breast tenderness and drainage.  She plans to be seen in the next day or so.   All other systems reviewed and are negative.        Objective:         ??? albuterol 0.083% (PROVENTIL) 2.5 mg /3 mL (0.083 %) nebulizer solution Inhale  solution by nebulizer as directed as Needed.   ??? allopurinol (ZYLOPRIM) 300 mg tablet Take 300 mg by mouth as Needed. Take with food.    ??? aspirin EC 81 mg tablet Take one tablet by mouth daily. Take with food.   ??? cholecalciferol(+) (Vitamin D3) (OPTIMAL D3) 50,000 units capsule Take 50,000 Units by mouth every 7 days.   ??? citalopram (CELEXA) 20 mg tablet Take 20 mg by mouth daily.   ??? fenofibrate nanocrystallized (TRICOR) 145 mg tablet Take one tablet by mouth daily. Take with food. ??? fish oil- omega 3-DHA/EPA 300/1,000 mg capsule Take two capsules by mouth twice daily with meals.   ??? furosemide (LASIX) 20 mg tablet Take 20 mg by mouth twice daily. Patient takes 40mg  in the morning and 20mg  in the afternoon   ??? icosapent ethyL (VASCEPA) 1 gram capsule Take two capsules by mouth twice daily with meals. Indications: high amount of triglyceride in the blood, treatment to prevent a heart attack, primary prevention of heart attack   ??? insulin glargine (LANTUS) 100 unit/mL injection Inject  under the skin. inject 68 units in the morning and 35 units at bedtime   ??? insulin regular(DIL) (HUMULIN R) 10 units/mL Inject  under the skin three times daily before meals. Take as directed    ??? metFORMIN (GLUCOPHAGE) 500 mg tablet Take 1 tablet twice daily for one week, then increase to 2 tablets twice daily thereafter. Take at the end of meals on a full stomach   ??? metoprolol XL (TOPROL XL) 25 mg extended release tablet Take 25 mg by mouth daily.   ??? nitroglycerin (NITROSTAT) 0.4 mg tablet DISSOLVE ONE TABLET UNDER THE TONGUE EVERY 5 MINUTES AS NEEDED FOR  CHEST PAIN.  DO NOT EXCEED A TOTAL OF 3 DOSES IN 15 MINUTES   ??? PROAIR HFA 90 mcg/actuation inhaler Inhale 2 puffs by mouth into the lungs as Needed.   ??? rosuvastatin (CRESTOR) 40 mg tablet Take one tablet by mouth daily.   ??? semaglutide (OZEMPIC) 0.25 mg or 0.5 mg(2 mg/1.5 mL) injection PEN Inject 0.25 mg subcutaneously every 7 days for 1 month, then 0.5 mg weekly.   ??? SKYRIZI 75 mg/0.83 mL syringe Inject 150 mg under the skin every 90 days.   ??? tiotropium (SPIRIVA WITH HANDIHALER) 18 mcg capsule for inhaler Place 18 mcg into inhaler and inhale into lungs as directed daily.     Vitals:    09/09/19 1118   Weight: 77.1 kg (170 lb)   Height: 167.6 cm (66)   PainSc: Zero     Body mass index is 27.44 kg/m???.     Physical Exam  Constitutional:       Comments: Not examined.              Assessment and Plan:    1.  Type 2 diabetes, uncontrolled 2.  Severe dyslipidemia    Plan: We discussed a number of changes.  I suggested that she increase the morning metformin dose to 1500 mg and move the evening dose to lunchtime.  If she has more diarrhea as a result, she will let me know.  I recommended no changes in insulin at the present time, but have sent through a prescription for Ozempic.  We will see if her insurance approves this.  Finally I have generated a prescription for vast Cipro to take the place of generic fish oil.  She can store the latter in the freezer to minimize the unpleasantness associated with it.  She will keep me informed, especially if she has diarrhea or low blood glucoses.  I will schedule a follow-up appointment.  This was a 35-minute visit.

## 2019-09-10 DIAGNOSIS — E785 Hyperlipidemia, unspecified: Secondary | ICD-10-CM

## 2019-09-10 DIAGNOSIS — E1165 Type 2 diabetes mellitus with hyperglycemia: Secondary | ICD-10-CM

## 2019-10-22 ENCOUNTER — Encounter: Admit: 2019-10-22 | Discharge: 2019-10-22

## 2019-10-22 NOTE — Progress Notes
Received a request for risk stratification for R shoulder Arthroscopy with rotator cuff repair scheduled for 12/3.      Last OV 02/19/2019  Last MPI and echo 04/2018    Will plan for OV prior to surgery since last OV > 1 yr.

## 2019-10-27 ENCOUNTER — Encounter: Admit: 2019-10-27 | Discharge: 2019-10-27

## 2019-10-27 NOTE — Telephone Encounter
[employer]My[employer]eds calling to provide PA key to access.  Key = [id number]  PA notification - "unable to locate member".  Routing to provider nurse.

## 2019-10-29 ENCOUNTER — Encounter: Admit: 2019-10-29 | Discharge: 2019-10-29

## 2019-10-29 DIAGNOSIS — I10 Essential (primary) hypertension: Secondary | ICD-10-CM

## 2019-10-29 DIAGNOSIS — R55 Syncope and collapse: Secondary | ICD-10-CM

## 2019-10-29 DIAGNOSIS — E1139 Type 2 diabetes mellitus with other diabetic ophthalmic complication: Secondary | ICD-10-CM

## 2019-10-29 DIAGNOSIS — R609 Edema, unspecified: Secondary | ICD-10-CM

## 2019-10-29 DIAGNOSIS — R079 Chest pain, unspecified: Secondary | ICD-10-CM

## 2019-10-29 DIAGNOSIS — I25118 Atherosclerotic heart disease of native coronary artery with other forms of angina pectoris: Secondary | ICD-10-CM

## 2019-10-29 DIAGNOSIS — E119 Type 2 diabetes mellitus without complications: Secondary | ICD-10-CM

## 2019-10-29 DIAGNOSIS — G459 Transient cerebral ischemic attack, unspecified: Secondary | ICD-10-CM

## 2019-10-29 DIAGNOSIS — E785 Hyperlipidemia, unspecified: Secondary | ICD-10-CM

## 2019-10-29 NOTE — Progress Notes
Date of Service: 10/29/2019    Traci Wade is a 54 y.o. female.       HPI     Traci Wade is followed for coronary artery disease.  She works at the [employer] and contracted Covid on May 15.  She was hospitalized for 3 days in [place] and was then transferred to [facility] for 7 days.  She did not require intubation.  She still has some lingering dyspnea with exertion that is gradually improving with time.   During that hospitalization her triglycerides were markedly elevated and she was placed on fenofibrate, Vascepa and high dose of rosuvastatin.   The patient reports that she was instructed to stop taking her Brilinta in August 2020.  She believes that her blood sugars are under satisfactory control. She indicates that she is busy working at the [employer], taking care of kids and grandkids.  Currently her greatest problem is pain in her right shoulder from a rotator cuff tear and tendinitis in her left forearm.  She indicates that she is considering repair of her rotator cuff injury.  She does not sleep well and awakens almost every night at approximately 2 or 3 AM and has difficulty falling back asleep.  She reports that she partially completed a sleep study. Otherwise, Traci Wade indicates?that she has been stable from a cardiovascular perspective and?reports no angina, congestive symptoms, potation's, sensation of sustained forceful heart pounding, lightheadedness or syncope.??Her exercise tolerance has been stable. The patient reports no myalgias, bleeding abnormalities,  strokelike symptoms or difficulty with speech. Traci Wade?reports that she has diabetic neuropathy, nephropathy and retinopathy. ?She has psoriasis and has noted a marked improvement while taking Taltz.?Traci Wade has a remote?history of cigarette smoking?and no history of?hypertension. ??The patient reports a family history for coronary artery disease. ?Traci Wade believes that she may have sleep apnea and occasional acid reflux symptoms. 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  The patient reports no history of stroke or transient ischemic attack, congestive heart failure or significant renal insufficiency.  Historically, Traci Wade was seen on June 05, 2018 for exertional chest discomfort suggestive for angina. ?She had also recently had undergone stress testing?was abnormal. ?She was referred for coronary angiography which was performed on 06/12/2018. ?This revealed a high-grade stenosis in mid left anterior descending?along with an?occluded small diagonal branch.??She underwent stenting of the left anterior descending coronary artery with a drug-eluting stent with excellent results.          Vitals:    10/29/19 1441   BP: 124/78   BP Source: Arm, Left Upper   Pulse: 99   Temp: 37.2 ?C (99 ?F)   SpO2: 97%   Weight: 80.7 kg (178 lb)   Height: 1.676 m (5' 6)   PainSc: Eight     Body mass index is 28.73 kg/m?Marland Kitchen     Past Medical History  Patient Active Problem List    Diagnosis Date Noted   ? Hypertriglyceridemia 06/09/2019   ? Coronary artery disease of native artery of native heart with stable angina pectoris [facility]) 06/12/2018     06/12/2018 - cath with high grade stenosis in the mid left anterior descending artery status post percutaneous coronary intervention with drug-eluting stent x1.  Initiated on aspirin and Brilinta     ? Type 2 diabetes mellitus with ophthalmic complication (HCC) 06/12/2018   ?  Dyslipidemia, goal LDL below 70 06/12/2018     Lab Results   Component Value Date    CHOL 295 06/12/2018    TRIG 503 06/12/2018    HDL 44 06/12/2018    LDL 164 06/12/2018    VLDL 101 06/12/2018   Started on Crestor 20 mg daily      ? Coronary artery disease 06/12/2018   ? Chest pain 05/29/2018 05/21/2018 MPI  SUMMARY/OPINION:??1. Mildly abnormal study.  Small reversible/ischemic apical anteroseptal defect (1-2 segments).  With associated mild post-rest hypokinesis. Preserved apical and remaining myocardium uptake.  2. Low risk features for annual cardiovascular mortality rate include: Summed stress score of 2, no transient ischemic dilatation, normal lung to heart ratio, normal ejection fraction of 60%.  3. Nonischemic ECG response to infusion.    05/21/2018 echo: No regional wall motion abnormalities are seen. Overall LV systolic function appears normal. The estimated left ventricular ejection fraction is 60%. There is mild concentric left ventricular hypertrophy. Normal left ventricular diastolic function.  Right ventricular contractility appears normal.  Normal chamber dimensions.  There is no evidence of significant valvular regurgitation or stenosis by doppler exam.  No pericardial effusion is seen.    06/19/2011  LHC:  Normal coronary arteries and LV function.           ? Hypertension 05/29/2018   ? Peripheral vascular disease (HCC) 05/29/2018     05/03/2015  Carotid ultrasound:  Minimal atherosclerotic changes without evident hemodynamically significant plaque or stenosis.            Review of Systems   Constitution: Negative.   HENT: Negative.    Eyes: Negative.    Cardiovascular: Positive for dyspnea on exertion.   Endocrine: Negative.    Hematologic/Lymphatic: Negative.    Skin: Negative.    Musculoskeletal: Negative.    Gastrointestinal: Negative.    Genitourinary: Negative.    Neurological: Negative.    Psychiatric/Behavioral: Negative.    Allergic/Immunologic: Negative.        Physical Exam  GENERAL: The patient is well developed, well nourished, resting comfortably and in no distress.   HEENT: No abnormalities of the visible oro-nasopharynx, conjunctiva or sclera are noted.  NECK: There is no jugular venous distension. Carotids are palpable and without bruits. There is no thyroid enlargement. Chest: Lung fields are clear to auscultation. There are no wheezes or crackles.  CV: There is a regular rhythm. The first and second heart sounds are normal. There are no murmurs, gallops or rubs.??Her apical heart rate is?84?bpm.  ABD: The abdomen is soft and supple with normal bowel sounds. There is no hepatosplenomegaly, ascites, tenderness, masses or bruits.  Neuro: There are no focal motor defects. Ambulation is normal. Cognitive function appears normal.  Ext:?There is no edema or evidence of deep vein thrombosis. Peripheral pulses are satisfactory. ?  SKIN:??Few psoriatic patches on her extremities.  PSYCH:?The patient is calm, rationale and oriented    Cardiovascular Studies  A twelve-lead ECG was obtained on 10/29/2019 reveals normal sinus rhythm with a heart rate of 87 bpm.  Left axis deviation is noted.  There is no evidence of myocardial ischemia or infarction.    Coronary angiography/intervention 06/12/2018:  SELECTIVE CORONARY ANGIOGRAPHY:??LEFT MAIN CORONARY ARTERY: ?The left main coronary artery is a medium caliber vessel which distally bifurcates into LAD and circumflex, and is free of angiographically significant disease. LAD: ?The LAD is a medium caliber vessel, which gives off 1 diagonal branch. ?The diagonal branch is occluded proximally. ?The LAD has a  focal area of 99% stenosis in its midportion. ?This is bordered by 60% to 70% stenosis as well. ?The remainder of the vessel has only mild luminal irregularities. LEFT CIRCUMFLEX: ?The circumflex is a medium caliber vessel which gives off 1 small high obtuse marginal branch, which has mild plaquing. ?The midportion of the circumflex has a diffuse area of 50% stenosis. ?The remainder of the vessel has mild luminal irregularities. RIGHT CORONARY ARTERY: ?The right coronary artery is a medium caliber vessel, which is dominant. ?It has diffuse mild plaquing present. ?It has an anterior takeoff.  ? INTERVENTIONAL PORTION OF PROCEDURE:??The catheter was exchanged for a JL3.5 guide catheter which was advanced to the left main coronary ostium. ?Initial ACT was 210 after 5000 units of heparin. ?An additional 2000 was given at that time. ?A Luge wire was then advanced to the distal portion of the LAD. ?The lesion was pre-dilated with a 2.0 compliant balloon. ?A 3.0 x 28 Xience DES was then placed in the midportion of the LAD, completely covering the diseased portion. ?It was deployed at high pressure. ?A 3.25 noncompliant balloon was used to perform high-pressure postdilatation throughout the stented area. ?There was an excellent angiographic result. ?TIMI grade 3 flow was maintained throughout the procedure and at its conclusion. ?There was no evidence of edge dissection or perforation and 0% residual stenosis.   IMPRESSION:??  1. High-grade stenosis in mid left anterior descending, status post percutaneous coronary intervention with drug-eluting stent times 1.  2. Occluded small diagonal branch.  3. Normal left ventricular end diastolic pressure.  ?  RECOMMENDATIONS:??  1. Aspirin on a daily basis indefinitely.  2. Ticagrelor twice daily for a minimum of 6 months.    Problems Addressed Today  Coronary artery disease.  Hypercholesterolemia.  Diabetes mellitus.  Assessment and Plan     Ms. Ewy appears stable from a cardiovascular perspective.  She reports no angina or congestive symptoms and her blood pressure appears to be under satisfactory control. A clinician patient risk discussion was held with the patient today concerning dual antiplatelet therapy as part of shared decision making.  It has been over a year since she underwent stenting of her left anterior descending coronary artery and she does not want to add clopidogrel to her present medical regimen out of concern for bleeding complications.  She is contemplating orthopedic surgery for repair of her right rotator cuff tear.  In order to further evaluate her risk for cardiovascular morbidity and mortality associated with non-cardiac surgery, I have asked her to obtain an echo-doppler study to assess for structural cardiac abnormalities as well as a regadenoson thallium stress test to assess for objective evidence of myocardial ischemia prior to surgery. Her surgical risk can then be further addressed after these studies are obtained. I have asked her to return for follow-up in 6 months.         Current Medications (including today's revisions)  ? albuterol 0.083% (PROVENTIL) 2.5 mg /3 mL (0.083 %) nebulizer solution Inhale  solution by nebulizer as directed as Needed.   ? allopurinol (ZYLOPRIM) 300 mg tablet Take 300 mg by mouth as Needed. Take with food.    ? aspirin EC 81 mg tablet Take one tablet by mouth daily. Take with food.   ? cholecalciferol(+) (Vitamin D3) (OPTIMAL D3) 50,000 units capsule Take 50,000 Units by mouth every 7 days.   ? citalopram (CELEXA) 20 mg tablet Take 20 mg by mouth daily.   ? fenofibrate nanocrystallized (TRICOR) 145 mg tablet Take  one tablet by mouth daily. Take with food.   ? fish oil- omega 3-DHA/EPA 300/1,000 mg capsule Take two capsules by mouth twice daily with meals.   ? furosemide (LASIX) 20 mg tablet Take 20 mg by mouth twice daily. Patient takes 40mg  in the morning and 20mg  in the afternoon   ? icosapent ethyL (VASCEPA) 1 gram capsule Take two capsules by mouth twice daily with meals. Indications: high amount of triglyceride in the blood, treatment to prevent a heart attack, primary prevention of heart attack   ? insulin glargine (LANTUS) 100 unit/mL injection Inject  under the skin. inject 68 units in the morning and 35 units at bedtime   ? insulin regular(DIL) (HUMULIN R) 10 units/mL Inject  under the skin three times daily before meals. Take as directed    ? metFORMIN (GLUCOPHAGE) 500 mg tablet Take 1 tablet twice daily for one week, then increase to 2 tablets twice daily thereafter. Take at the end of meals on a full stomach   ? metoprolol XL (TOPROL XL) 25 mg extended release tablet Take 25 mg by mouth daily.   ? nitroglycerin (NITROSTAT) 0.4 mg tablet DISSOLVE ONE TABLET UNDER THE TONGUE EVERY 5 MINUTES AS NEEDED FOR CHEST PAIN.  DO NOT EXCEED A TOTAL OF 3 DOSES IN 15 MINUTES   ? PROAIR HFA 90 mcg/actuation inhaler Inhale 2 puffs by mouth into the lungs as Needed.   ? rosuvastatin (CRESTOR) 40 mg tablet Take one tablet by mouth daily.   ? semaglutide (OZEMPIC) 0.25 mg or 0.5 mg(2 mg/1.5 mL) injection PEN Inject 0.25 mg subcutaneously every 7 days for 1 month, then 0.5 mg weekly.   ? SKYRIZI 75 mg/0.83 mL syringe Inject 150 mg under the skin every 90 days.   ? tiotropium (SPIRIVA WITH HANDIHALER) 18 mcg capsule for inhaler Place 18 mcg into inhaler and inhale into lungs as directed daily.

## 2019-11-03 ENCOUNTER — Encounter: Admit: 2019-11-03 | Discharge: 2019-11-03

## 2019-11-04 ENCOUNTER — Encounter: Admit: 2019-11-04 | Discharge: 2019-11-04

## 2019-11-04 NOTE — Telephone Encounter
LVM for NUC, no caffeine -including chocolate 24 hours prior to test, don't eat/drink after midnight except for water, hold DM meds and water pill am of test and it is a two part test, to call back if any questions.

## 2019-11-06 ENCOUNTER — Encounter: Admit: 2019-11-06 | Discharge: 2019-11-06

## 2019-11-06 ENCOUNTER — Ambulatory Visit: Admit: 2019-11-06 | Discharge: 2019-11-06 | Payer: BC Managed Care – PPO

## 2019-11-06 DIAGNOSIS — I10 Essential (primary) hypertension: Secondary | ICD-10-CM

## 2019-11-06 DIAGNOSIS — I25118 Atherosclerotic heart disease of native coronary artery with other forms of angina pectoris: Secondary | ICD-10-CM

## 2019-11-06 MED ORDER — ALBUTEROL SULFATE 90 MCG/ACTUATION IN HFAA
2 | RESPIRATORY_TRACT | 0 refills | Status: DC | PRN
Start: 2019-11-06 — End: 2019-11-11

## 2019-11-06 MED ORDER — NITROGLYCERIN 0.4 MG SL SUBL
.4 mg | SUBLINGUAL | 0 refills | Status: DC | PRN
Start: 2019-11-06 — End: 2019-11-11

## 2019-11-06 MED ORDER — REGADENOSON 0.4 MG/5 ML IV SYRG
.4 mg | Freq: Once | INTRAVENOUS | 0 refills | Status: CP
Start: 2019-11-06 — End: ?
  Administered 2019-11-06: 16:00:00 0.4 mg via INTRAVENOUS

## 2019-11-06 MED ORDER — AMINOPHYLLINE 500 MG/20 ML IV SOLN
50 mg | INTRAVENOUS | 0 refills | Status: AC | PRN
Start: 2019-11-06 — End: ?
  Administered 2019-11-06: 16:00:00 50 mg via INTRAVENOUS

## 2019-11-06 MED ORDER — EUCALYPTUS-MENTHOL MM LOZG
1 | Freq: Once | ORAL | 0 refills | Status: AC | PRN
Start: 2019-11-06 — End: ?

## 2019-11-06 MED ORDER — SODIUM CHLORIDE 0.9 % IV SOLP
250 mL | INTRAVENOUS | 0 refills | Status: AC | PRN
Start: 2019-11-06 — End: ?

## 2019-11-11 ENCOUNTER — Encounter: Admit: 2019-11-11 | Discharge: 2019-11-11

## 2019-11-11 NOTE — Telephone Encounter
-----   Message from Nehemiah Massed, MD sent at 11/09/2019  9:02 AM CST -----  Sherlon Handing: Her stress test looks favorable.  Please let her know.  I will conclude her preoperative evaluation as soon as her echo Doppler study is completed.  It appears to be scheduled for 11/18/2019.  Thanks.  SBG  ----- Message -----  From: Dahlia Byes, MD  Sent: 11/09/2019   8:28 AM CST  To: Nehemiah Massed, MD

## 2019-11-11 NOTE — Telephone Encounter
Results and recommendations called to patient lmom requested call back if questions

## 2019-11-18 ENCOUNTER — Ambulatory Visit: Admit: 2019-11-18 | Discharge: 2019-11-18 | Payer: Worker's Compensation

## 2019-11-18 ENCOUNTER — Encounter: Admit: 2019-11-18 | Discharge: 2019-11-18

## 2019-11-18 DIAGNOSIS — I25118 Atherosclerotic heart disease of native coronary artery with other forms of angina pectoris: Secondary | ICD-10-CM

## 2019-11-18 DIAGNOSIS — I10 Essential (primary) hypertension: Secondary | ICD-10-CM

## 2019-11-19 ENCOUNTER — Encounter: Admit: 2019-11-19 | Discharge: 2019-11-19

## 2019-11-20 ENCOUNTER — Encounter: Admit: 2019-11-20 | Discharge: 2019-11-20

## 2019-11-20 NOTE — Telephone Encounter
-----   Message from Nehemiah Massed, MD sent at 11/19/2019  3:23 PM CST -----  Lattie Haw and Steve:Ms. Totton's preoperative evaluation has been completed and appears as an addendum to my clinic note dated 10/29/2019.  Please send to her surgical and anesthesia teams.  The subject of aspirin is addressed.  Please review the patient's recent echo Doppler and regadenoson thallium stress test with her.  Thanks.  SBG  ----- Message -----  From: Jerrel Ivory, MD  Sent: 11/18/2019   1:56 PM CST  To: Nehemiah Massed, MD

## 2020-03-21 ENCOUNTER — Encounter: Admit: 2020-03-21 | Discharge: 2020-03-21 | Payer: BC Managed Care – PPO

## 2020-09-20 ENCOUNTER — Encounter: Admit: 2020-09-20 | Discharge: 2020-09-20 | Payer: BC Managed Care – PPO

## 2020-09-20 DIAGNOSIS — E1165 Type 2 diabetes mellitus with hyperglycemia: Secondary | ICD-10-CM

## 2020-09-20 DIAGNOSIS — E785 Hyperlipidemia, unspecified: Secondary | ICD-10-CM

## 2020-09-20 MED ORDER — OZEMPIC 0.25 MG OR 0.5 MG(2 MG/1.5 ML) SC PNIJ
0 refills
Start: 2020-09-20 — End: ?

## 2020-09-24 IMAGING — CR CHEST
2 series · 2 of 2 positions shown · non-contrast
Comparison: none

[chest pa x-wise]
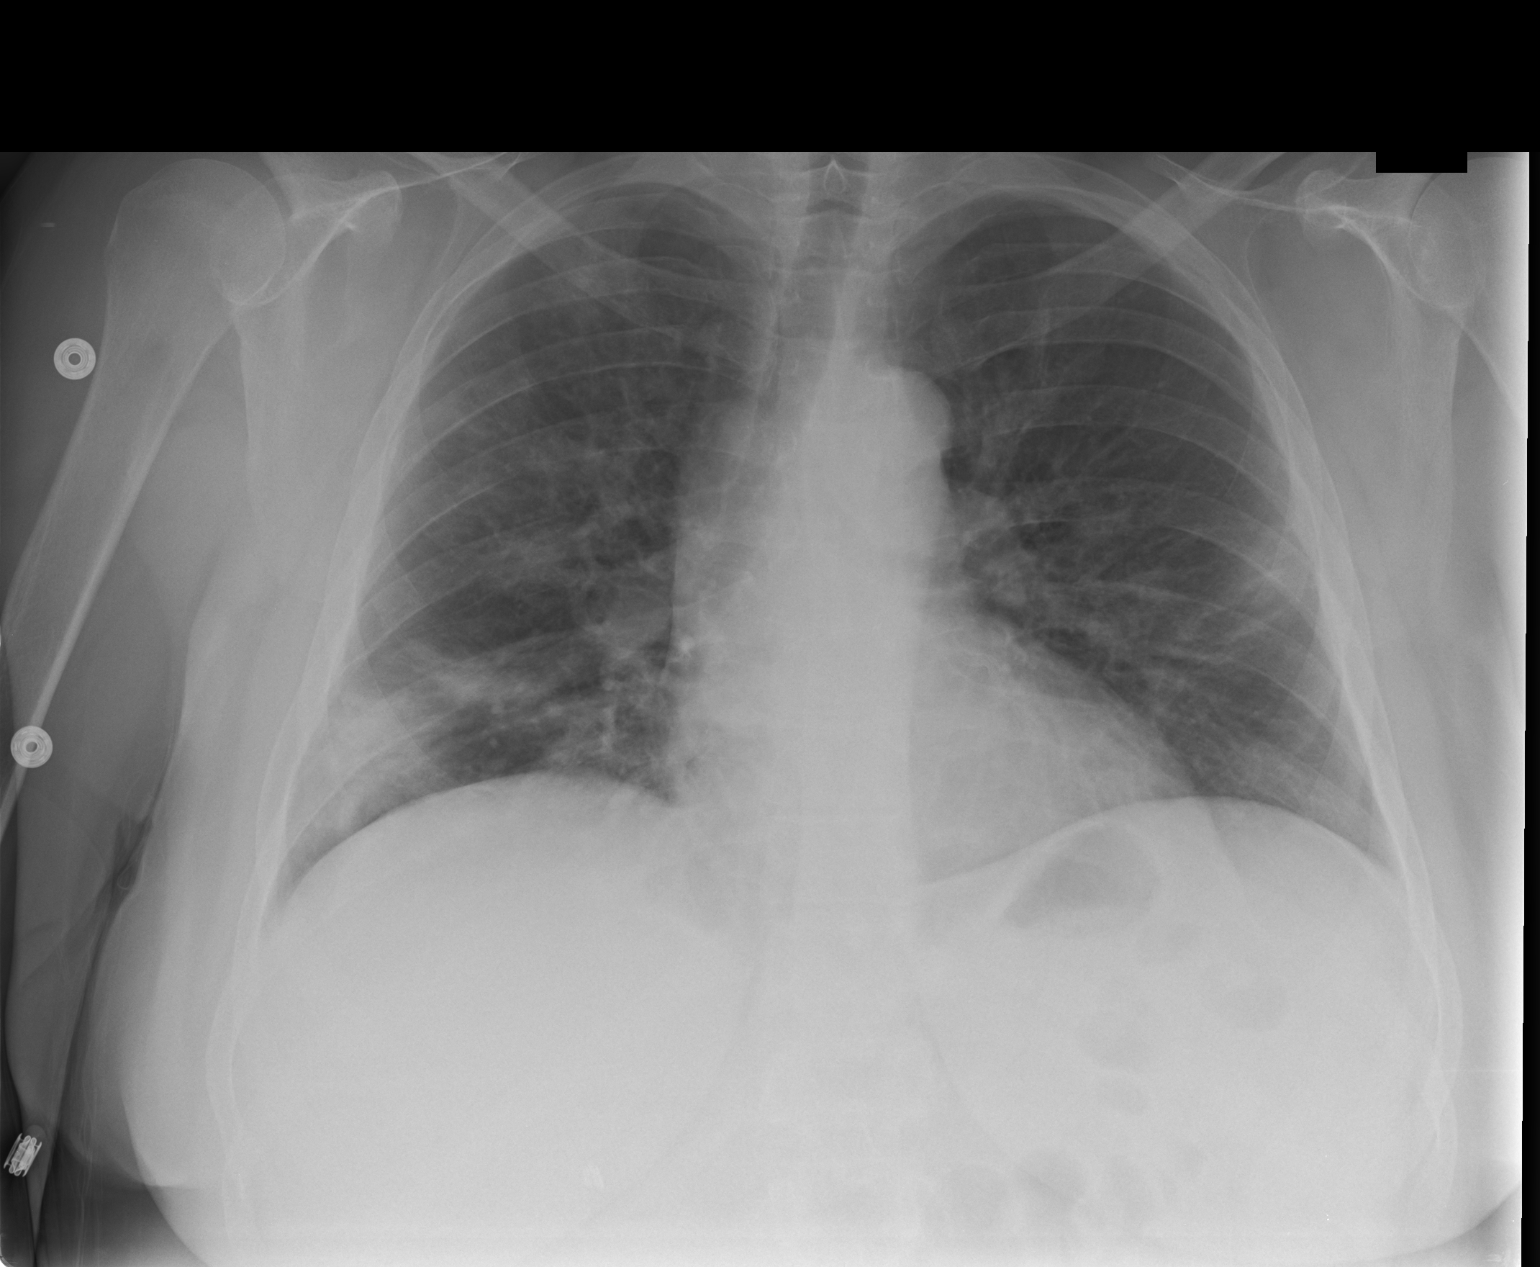

[chest lat]
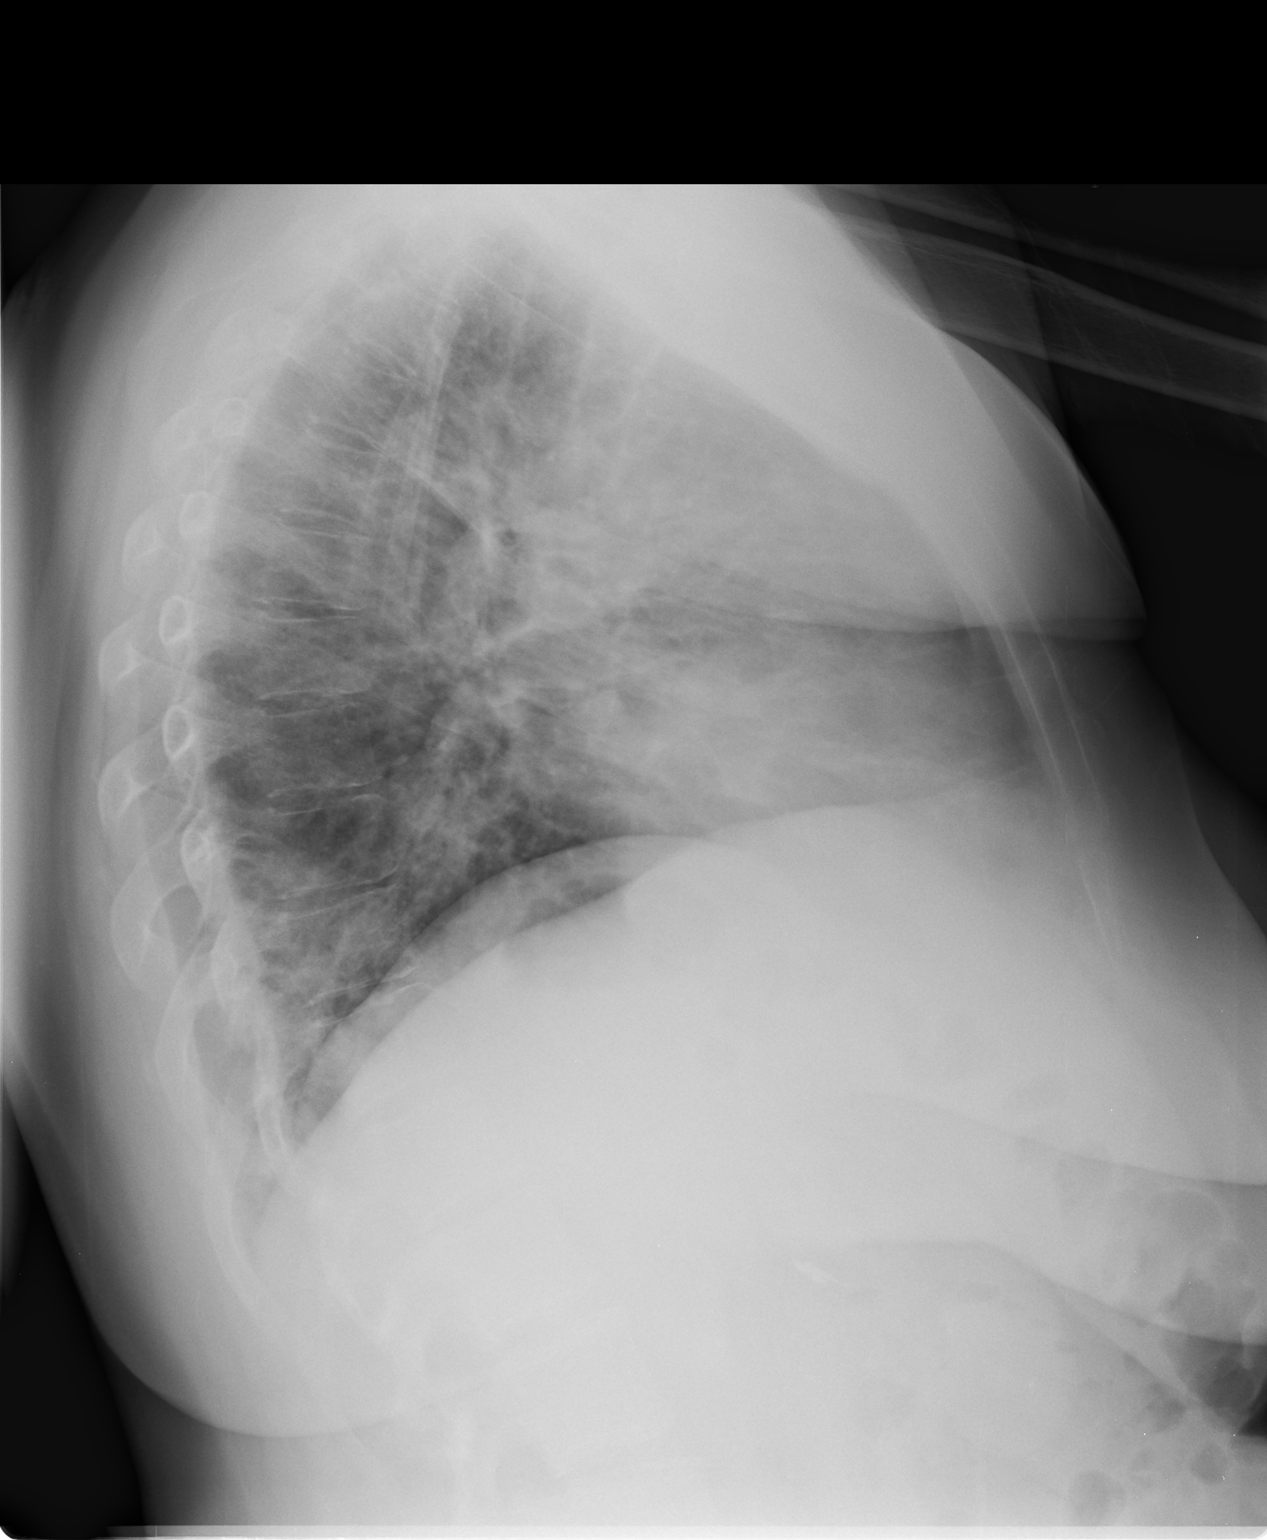

[2 of 2 positions shown; findings below may reference images not displayed]

DIAGNOSTIC STUDIES

EXAM

Chest radiographs.

INDICATION

shortness of breath
SOA, cough and fever worsening. TM/ME

TECHNIQUE

PA and lateral chest views.

COMPARISONS

May 17, 2019

FINDINGS

Increasing patchy alveolar opacities noted bilaterally, most pronounced in the right lung
base/lower lobe. The upper lungs are mostly clear. The cardiomediastinal silhouette is not enlarged.

IMPRESSION

Increasing alveolar opacities, particularly in the right lung base.

Tech Notes:

SOA, cough and fever worsening. TM/ME

## 2020-09-26 IMAGING — CR CHEST
2 series · 2 of 2 positions shown · non-contrast
Comparison: none

[chest ap grid]
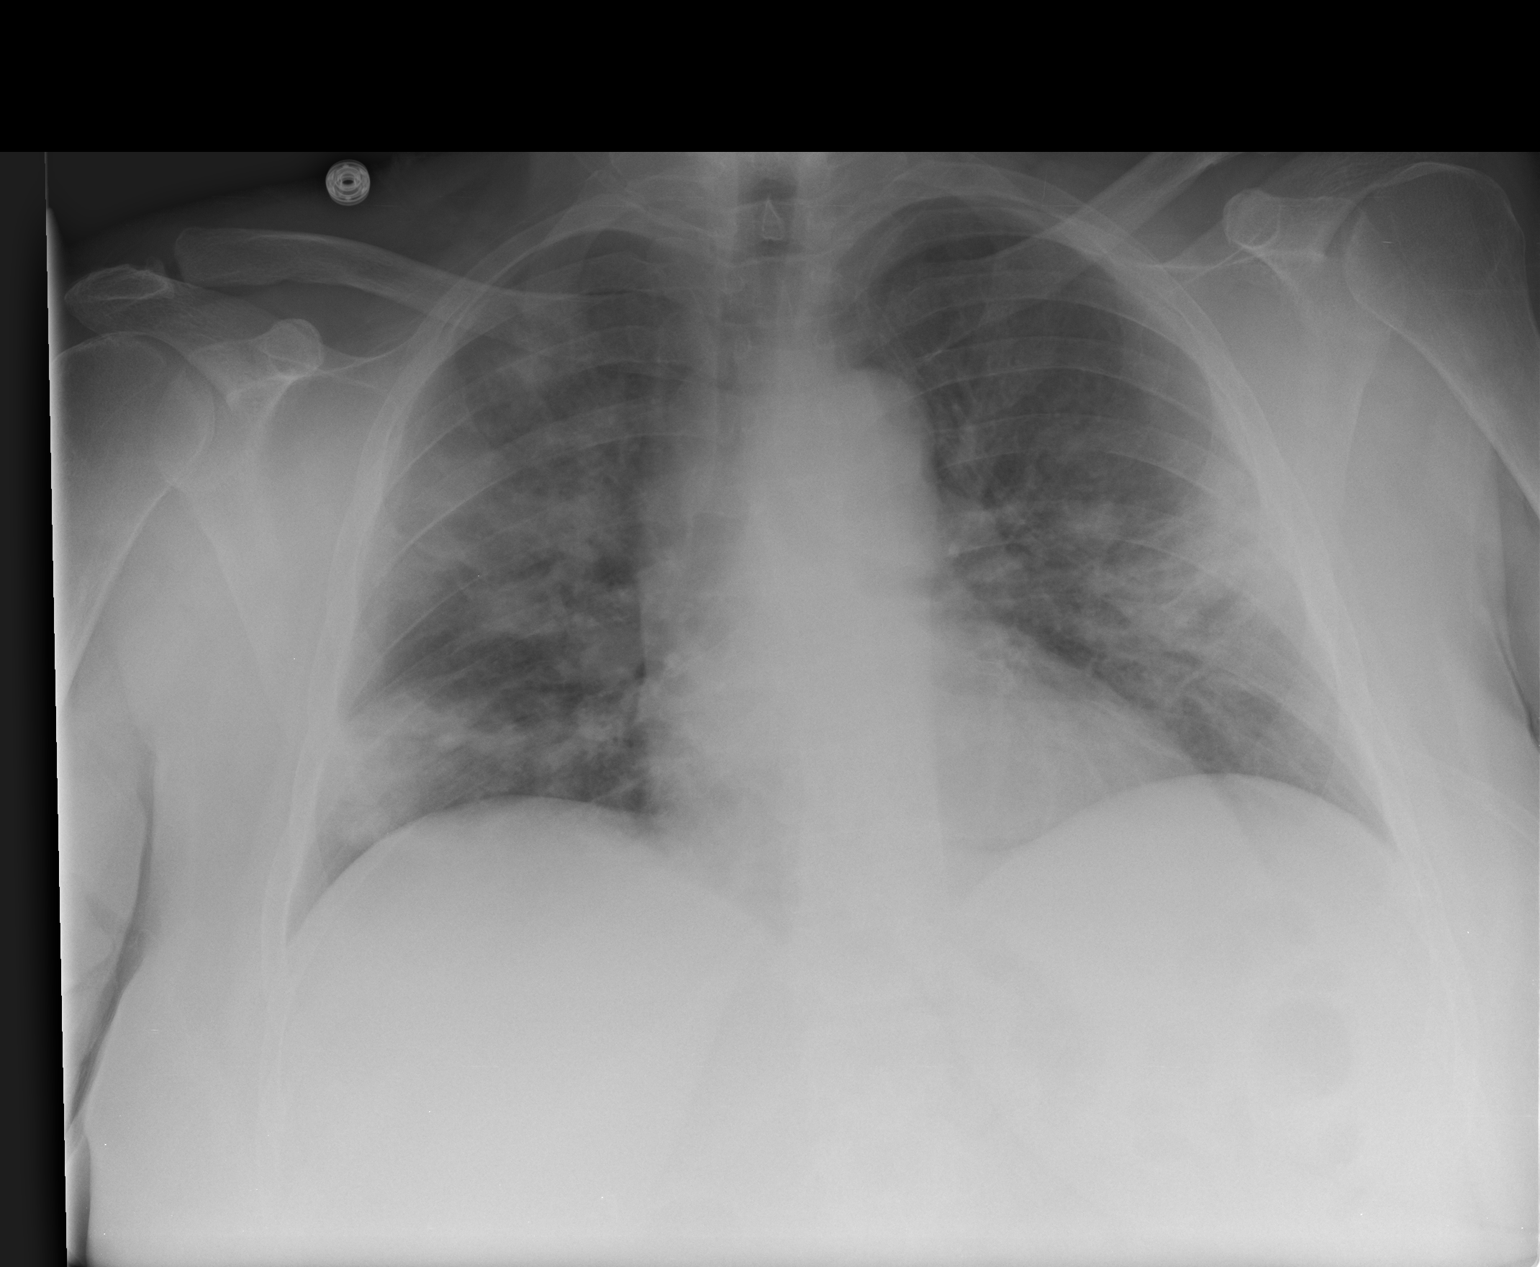

[chest lat]
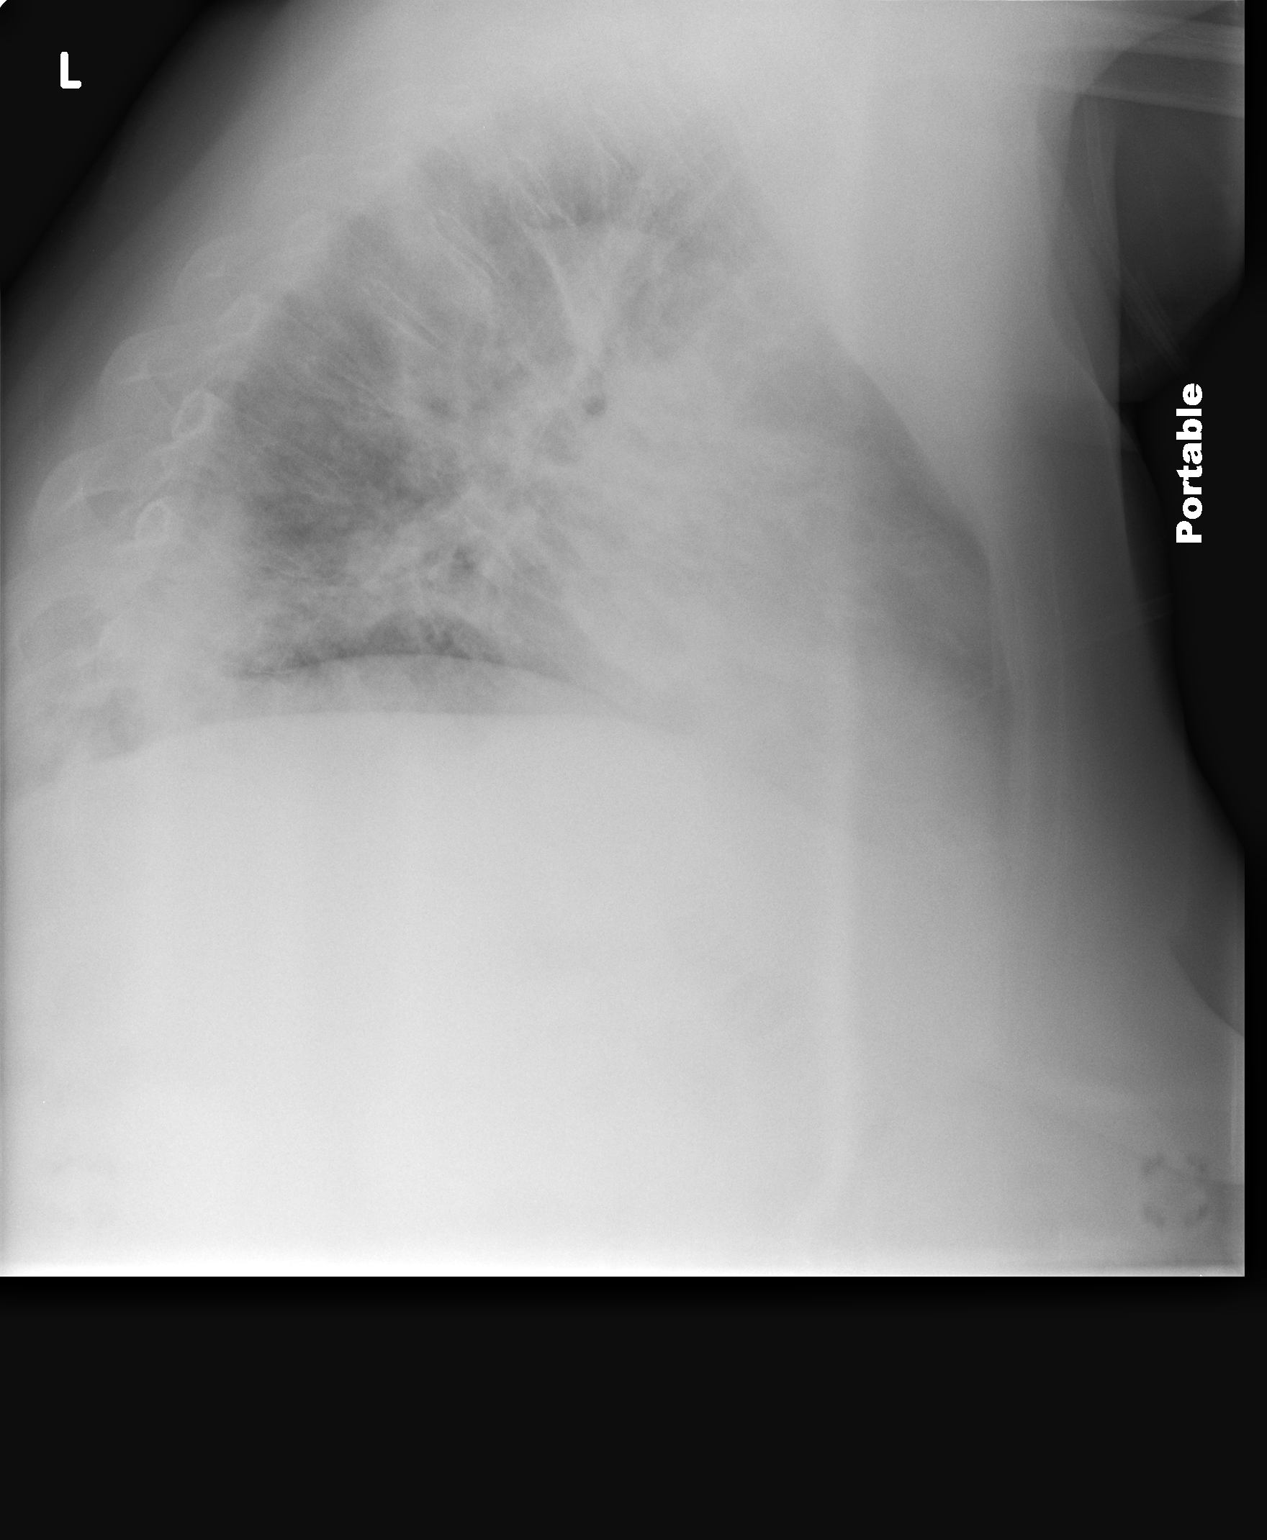

[2 of 2 positions shown; findings below may reference images not displayed]

EXAM

CHEST 2 VIEW

INDICATION

hypoxia with COVID
Hypoxia. COVID POSITIVE. Prior CXR 05/19/19. CC/LM

TECHNIQUE

2 views of the chest were acquired.

COMPARISONS

May 19, 2019.

FINDINGS

The cardiomediastinal silhouette is within normal limits.  There is no failure or effusion.

Worsening bilateral patchy infiltrates are noted.

IMPRESSION

Bilateral patchy infiltrates are noted. These appear worse than the previous study.

Tech Notes:

Hypoxia. COVID POSITIVE. Prior CXR 05/19/19. CC/LM

## 2020-12-15 ENCOUNTER — Encounter: Admit: 2020-12-15 | Discharge: 2020-12-15 | Payer: BC Managed Care – PPO

## 2020-12-15 DIAGNOSIS — E785 Hyperlipidemia, unspecified: Secondary | ICD-10-CM

## 2020-12-15 DIAGNOSIS — E782 Mixed hyperlipidemia: Secondary | ICD-10-CM

## 2020-12-15 DIAGNOSIS — E1165 Type 2 diabetes mellitus with hyperglycemia: Secondary | ICD-10-CM

## 2020-12-15 MED ORDER — ICOSAPENT ETHYL 1 GRAM PO CAP
ORAL_CAPSULE | Freq: Two times a day (BID) | 0 refills
Start: 2020-12-15 — End: ?

## 2020-12-22 ENCOUNTER — Encounter: Admit: 2020-12-22 | Discharge: 2020-12-22 | Payer: BC Managed Care – PPO

## 2020-12-22 DIAGNOSIS — E782 Mixed hyperlipidemia: Secondary | ICD-10-CM

## 2020-12-22 DIAGNOSIS — E1165 Type 2 diabetes mellitus with hyperglycemia: Secondary | ICD-10-CM

## 2020-12-22 DIAGNOSIS — E785 Hyperlipidemia, unspecified: Secondary | ICD-10-CM

## 2020-12-22 MED ORDER — ROSUVASTATIN 40 MG PO TAB
ORAL_TABLET | Freq: Every day | ORAL | 0 refills | 90.00000 days | Status: AC
Start: 2020-12-22 — End: ?

## 2020-12-22 MED ORDER — ICOSAPENT ETHYL 1 GRAM PO CAP
ORAL_CAPSULE | Freq: Two times a day (BID) | 0 refills
Start: 2020-12-22 — End: ?

## 2021-02-23 ENCOUNTER — Encounter: Admit: 2021-02-23 | Discharge: 2021-02-23 | Payer: BC Managed Care – PPO

## 2021-02-28 ENCOUNTER — Encounter: Admit: 2021-02-28 | Discharge: 2021-02-28 | Payer: BC Managed Care – PPO

## 2021-02-28 ENCOUNTER — Ambulatory Visit: Admit: 2021-02-28 | Discharge: 2021-02-28 | Payer: BC Managed Care – PPO

## 2021-02-28 DIAGNOSIS — G459 Transient cerebral ischemic attack, unspecified: Secondary | ICD-10-CM

## 2021-02-28 DIAGNOSIS — I10 Essential (primary) hypertension: Secondary | ICD-10-CM

## 2021-02-28 DIAGNOSIS — R079 Chest pain, unspecified: Secondary | ICD-10-CM

## 2021-02-28 DIAGNOSIS — E119 Type 2 diabetes mellitus without complications: Secondary | ICD-10-CM

## 2021-02-28 DIAGNOSIS — I25118 Atherosclerotic heart disease of native coronary artery with other forms of angina pectoris: Secondary | ICD-10-CM

## 2021-02-28 DIAGNOSIS — E1165 Type 2 diabetes mellitus with hyperglycemia: Secondary | ICD-10-CM

## 2021-02-28 DIAGNOSIS — E785 Hyperlipidemia, unspecified: Secondary | ICD-10-CM

## 2021-02-28 DIAGNOSIS — E1139 Type 2 diabetes mellitus with other diabetic ophthalmic complication: Secondary | ICD-10-CM

## 2021-02-28 DIAGNOSIS — R609 Edema, unspecified: Secondary | ICD-10-CM

## 2021-02-28 DIAGNOSIS — E782 Mixed hyperlipidemia: Secondary | ICD-10-CM

## 2021-02-28 DIAGNOSIS — Z8639 Personal history of other endocrine, nutritional and metabolic disease: Secondary | ICD-10-CM

## 2021-02-28 DIAGNOSIS — R55 Syncope and collapse: Secondary | ICD-10-CM

## 2021-02-28 MED ORDER — ROSUVASTATIN 40 MG PO TAB
40 mg | ORAL_TABLET | Freq: Every day | ORAL | 3 refills | 90.00000 days | Status: AC
Start: 2021-02-28 — End: ?

## 2021-02-28 MED ORDER — ICOSAPENT ETHYL 1 GRAM PO CAP
2 g | ORAL_CAPSULE | Freq: Two times a day (BID) | ORAL | 3 refills | Status: AC
Start: 2021-02-28 — End: ?

## 2021-02-28 MED ORDER — FENOFIBRATE NANOCRYSTALLIZED 145 MG PO TAB
145 mg | ORAL_TABLET | Freq: Every day | ORAL | 3 refills | 30.00000 days | Status: AC
Start: 2021-02-28 — End: ?

## 2021-02-28 NOTE — Patient Instructions
Thank you for visiting our office today.   We recommend follow-up with our office in 6 months.   Please call [phone] in 2 weeks to schedule the office visit Dr. Bufford Buttner ordered.     Please complete the following orders:     Please schedule the stress test ordered by Dr. Bufford Buttner today before leaving. Call [phone] to schedule if unable to schedule today.    Take your medications as ordered.   Check your list with what you have on hand at home.   Should you have any additional questions or concerns, please message me through MyChart or call the office.    Cardiovascular Medicine Team   Clinic phone: [phone]         [facility] Nuclear Stress Test Instructions    PLEASE REPORT TO:    ____[facility] ([address], [address], [place], [place]) - [phone]   ____[facility] ([address], [address], [place], [place]) - [phone]    ____[facility] ([address] [place], [place]) - [phone]    ____[facility] ([address], [place], [place]) - [phone]   ____[facility] ([address], [place], [place]) - [phone]     [facility] Main Phone Number: [phone]     Date of Test  ____________  at ____________  for ________________________        The Thallium evaluation has two parts -- two nuclear scans.  The first scan is done in the morning and the second three to four hours later.   Wear comfortable clothing. Shorts or pants. (No dresses or skirts please).  Bring or wear sneakers/walking shoes if you are walking on [place].  Please let the nuclear technologists know if you plan on flying after the test.    NO CAFFEINE 24 HOURS PRIOR TO TEST. Examples: coffee, tea, decaf drinks, cola, chocolate.     DO NOT EAT OR DRINK THE MORNING OF YOUR TEST unless otherwise instructed. (You may have a couple sips of water).    If you are a diabetic, if insulin dependent: please take one third of your insulin with two pieces of dry toast and a small juice). Bring insulin and medication with you to the test.     ___ TAKE MORNING MEDICATIONS WITH A COUPLE SIPS OF WATER PRIOR TO TEST.     HOLD THE FOLLOWING MEDICATIONS AS INDICATED BELOW:      ? Water pills -- furosemide (Lasix) -- on the morning before the procedure.  ? Diabetes pills -- metformin (Glucophage) -- on the morning before the procedure.       WHAT TO DO BETWEEN THE FIRST TWO THALLIUM SCANS:  1. No strenuous exercise should be performed during this time.  2. A light lunch is permissible. The technologist will give you a list of appropriate foods.  3. Please return 15 minutes prior to the schedule of your second scan. Our nuclear technologist will tell you exactly what time to return.  4. Please do not use tobacco products in between scans.  5. After the first scan is completed, you may resume usual medications.     TEST FINDINGS:  You will receive the results of the test within 7 business days of its completion by telephone, unless arranged differently at the time of the procedure.  If you have any questions concerning your thallium test or if you do not hear from your [facility] physician/or nurse within 7 business days, please  call the appropriate office checked above.      Instructions given by Marland Kitchen, RN

## 2021-02-28 NOTE — Progress Notes
Ambulatory (External) Cardiac Monitor Placement Record    Patient was seen today for placement of an ambulatory cardiac monitor.        Brand: [employer] ([employer]ioNet)  Serial Number: [id number]  Location where monitor was placed:  Clinic  Start Time and Date: 02/28/21 09:50 AM  Will Holter be returned by mail? Yes   Patient instructed to contact company phone number on the monitor box with questions regarding billing, placement, troubleshooting.     Ulyses Jarred, [place]

## 2021-02-28 NOTE — Progress Notes
02/28/2021 12:24 PM    Approved PA Case: [id number]       02/28/2021 12:15 PM    PA for Vascepa submitted through cover my meds. OV note and last FLP attached.     AXK:[id number]

## 2021-03-08 ENCOUNTER — Encounter: Admit: 2021-03-08 | Discharge: 2021-03-08 | Payer: BC Managed Care – PPO

## 2021-03-08 NOTE — Telephone Encounter
Patient would like to know holter monitor results. I explained that we uploaded the holter monitor report for the doctor's review. She will reach out through my chart to follow up. Patient didn't have any more questions.

## 2021-03-20 ENCOUNTER — Encounter: Admit: 2021-03-20 | Discharge: 2021-03-20 | Payer: BC Managed Care – PPO

## 2021-03-20 NOTE — Telephone Encounter
Left detailed message with stress test instructions.  Asked for a return call with questions.           [facility] Nuclear Stress Test Instructions    PLEASE REPORT TO:    ____[facility] ([address], [address], [place], [place]) - [phone]   ____[facility] ([address], [address], [place], [place]) - [phone]    ____[facility] ([address] [place], [place]) - [phone]    ____[facility] ([address], [address], [place], [place]) - [phone]   ____[facility] ([address] [place], [place]) - [phone]     [facility] Main Phone Number: [phone]     Date of Test  ____________  at ____________  for ________________________    Are you able to raise your arm up by your head for about 20 minutes? yes  Can you lie on your back for approximately 20 minutes with minimal movement? yes    The Thallium evaluation has two parts -- two nuclear scans.  The first scan is done in the morning and the second three to four hours later.   Wear comfortable clothing. Shorts or pants. (No dresses or skirts please).  Bring or wear sneakers/walking shoes if you are walking on [facility].  Please let the nuclear technologists know if you plan on flying after the test.    NO CAFFEINE 24 HOURS PRIOR TO TEST. Examples: coffee, tea, decaf drinks, cola, chocolate.     DO NOT EAT OR DRINK THE MORNING OF YOUR TEST unless otherwise instructed. (You may have a couple sips of water).    If you are a diabetic, if insulin dependent: please take one third of your insulin with two pieces of dry toast and a small juice). Bring insulin and medication with you to the test.     ___ TAKE MORNING MEDICATIONS WITH A COUPLE SIPS OF WATER PRIOR TO TEST.     HOLD THE FOLLOWING MEDICATIONS AS INDICATED BELOW:      ? Insulin on the morning before the procedure. Left on vm please take one third of your insulin with two pieces of dry toast and a small juice). Bring insulin and medication with you to the test.       WHAT TO DO BETWEEN THE FIRST TWO THALLIUM SCANS:  1. No strenuous exercise should be performed during this time.  2. A light lunch is permissible. The technologist will give you a list of appropriate foods.  3. Please return 15 minutes prior to the schedule of your second scan. Our nuclear technologist will tell you exactly what time to return.  4. Please do not use tobacco products in between scans.  5. After the first scan is completed, you may resume usual medications.     TEST FINDINGS:  You will receive the results of the test within 7 business days of its completion by telephone, unless arranged differently at the time of the procedure.  If you have any questions concerning your thallium test or if you do not hear from your CVM physician/or nurse within 7 business days, please call the appropriate office checked above.      Instructions given by Allen Norris, RN

## 2021-03-22 ENCOUNTER — Encounter: Admit: 2021-03-22 | Discharge: 2021-03-22 | Payer: BC Managed Care – PPO

## 2021-03-23 ENCOUNTER — Encounter: Admit: 2021-03-23 | Discharge: 2021-03-23 | Payer: BC Managed Care – PPO

## 2021-05-26 ENCOUNTER — Encounter: Admit: 2021-05-26 | Discharge: 2021-05-26 | Payer: BC Managed Care – PPO

## 2021-05-26 NOTE — Telephone Encounter
Left message with stress test instructions for her appointment on Wednesday including date, time, and location. Instructions included the following:    NO DECAF OR CAFFEINE 24 HOURS PRIOR TO TEST. Examples: coffee, tea, decaf coffee or tea, cola, chocolate.     DO NOT EAT OR DRINK THE MORNING OF YOUR TEST unless otherwise instructed. (You may have a couple sips of water.) If you are a diabetic, if insulin dependent: please take one third of your insulin with a light breakfast (two pieces of dry toast and a small juice). Bring insulin and medication with you to the test.     TAKE MORNING MEDICATIONS WITH A COUPLE SIPS OF WATER PRIOR TO TEST.     Hold all vitamins and supplements on the morning of your test.     Phone number provided for any additional questions or concerns.

## 2021-05-31 ENCOUNTER — Encounter: Admit: 2021-05-31 | Discharge: 2021-05-31 | Payer: BC Managed Care – PPO

## 2021-05-31 DIAGNOSIS — R079 Chest pain, unspecified: Secondary | ICD-10-CM

## 2021-05-31 DIAGNOSIS — E785 Hyperlipidemia, unspecified: Secondary | ICD-10-CM

## 2021-05-31 DIAGNOSIS — Z8639 Personal history of other endocrine, nutritional and metabolic disease: Secondary | ICD-10-CM

## 2021-05-31 MED ORDER — RP DX TL-201 THALLOUS CHL MCI
.75 | Freq: Once | INTRAVENOUS | 0 refills | Status: CP
Start: 2021-05-31 — End: ?

## 2021-05-31 MED ORDER — ALBUTEROL SULFATE 90 MCG/ACTUATION IN HFAA
2 | RESPIRATORY_TRACT | 0 refills | Status: AC | PRN
Start: 2021-05-31 — End: ?

## 2021-05-31 MED ORDER — AMINOPHYLLINE 500 MG/20 ML IV SOLN
50 mg | INTRAVENOUS | 0 refills | Status: AC | PRN
Start: 2021-05-31 — End: ?

## 2021-05-31 MED ORDER — REGADENOSON 0.4 MG/5 ML IV SYRG
.4 mg | Freq: Once | INTRAVENOUS | 0 refills | Status: CP
Start: 2021-05-31 — End: ?

## 2021-05-31 MED ORDER — NITROGLYCERIN 0.4 MG SL SUBL
.4 mg | SUBLINGUAL | 0 refills | Status: AC | PRN
Start: 2021-05-31 — End: ?

## 2021-05-31 MED ORDER — RP DX TL-201 THALLOUS CHL MCI
3 | Freq: Once | INTRAVENOUS | 0 refills | Status: CP
Start: 2021-05-31 — End: ?

## 2021-05-31 MED ORDER — EUCALYPTUS-MENTHOL MM LOZG
1 | Freq: Once | ORAL | 0 refills | Status: AC | PRN
Start: 2021-05-31 — End: ?

## 2021-05-31 MED ORDER — SODIUM CHLORIDE 0.9 % IV SOLP
250 mL | INTRAVENOUS | 0 refills | Status: AC | PRN
Start: 2021-05-31 — End: ?

## 2021-06-14 ENCOUNTER — Encounter: Admit: 2021-06-14 | Discharge: 2021-06-14 | Payer: BC Managed Care – PPO

## 2021-06-14 NOTE — Telephone Encounter
Myriam Jacobson, MD  P [facility] Nurse Gen Card Team [place]  No significant defects were noted on the stress test.     06/14/2021 7:56 AM   mychart message sent to patient regarding the above.

## 2023-03-11 ENCOUNTER — Encounter: Admit: 2023-03-11 | Discharge: 2023-03-11 | Payer: BC Managed Care – PPO

## 2023-03-19 ENCOUNTER — Encounter: Admit: 2023-03-19 | Discharge: 2023-03-19 | Payer: BC Managed Care – PPO

## 2023-03-28 ENCOUNTER — Encounter: Admit: 2023-03-28 | Discharge: 2023-03-28 | Payer: BC Managed Care – PPO

## 2023-03-28 DIAGNOSIS — E119 Type 2 diabetes mellitus without complications: Secondary | ICD-10-CM

## 2023-03-28 DIAGNOSIS — G459 Transient cerebral ischemic attack, unspecified: Secondary | ICD-10-CM

## 2023-03-28 DIAGNOSIS — I739 Peripheral vascular disease, unspecified: Secondary | ICD-10-CM

## 2023-03-28 DIAGNOSIS — E781 Pure hyperglyceridemia: Secondary | ICD-10-CM

## 2023-03-28 DIAGNOSIS — R079 Chest pain, unspecified: Secondary | ICD-10-CM

## 2023-03-28 DIAGNOSIS — I25118 Atherosclerotic heart disease of native coronary artery with other forms of angina pectoris: Secondary | ICD-10-CM

## 2023-03-28 DIAGNOSIS — E1139 Type 2 diabetes mellitus with other diabetic ophthalmic complication: Secondary | ICD-10-CM

## 2023-03-28 DIAGNOSIS — I10 Essential (primary) hypertension: Secondary | ICD-10-CM

## 2023-03-28 DIAGNOSIS — R0989 Other specified symptoms and signs involving the circulatory and respiratory systems: Secondary | ICD-10-CM

## 2023-03-28 DIAGNOSIS — R609 Edema, unspecified: Secondary | ICD-10-CM

## 2023-03-28 DIAGNOSIS — E785 Hyperlipidemia, unspecified: Secondary | ICD-10-CM

## 2023-03-28 DIAGNOSIS — I2089 Stable angina pectoris (HCC): Secondary | ICD-10-CM

## 2023-03-28 DIAGNOSIS — R55 Syncope and collapse: Secondary | ICD-10-CM

## 2023-03-28 MED ORDER — ASPIRIN 325 MG PO TAB
325 mg | Freq: Once | ORAL | 0 refills
Start: 2023-03-28 — End: ?

## 2023-03-28 MED ORDER — NITROGLYCERIN 0.4 MG SL SUBL
ORAL_TABLET | SUBLINGUAL | 3 refills | 9.00000 days | Status: AC
Start: 2023-03-28 — End: ?

## 2023-03-28 MED ORDER — METOPROLOL SUCCINATE 25 MG PO TB24
25 mg | ORAL_TABLET | Freq: Every day | ORAL | 3 refills | 90.00000 days | Status: AC
Start: 2023-03-28 — End: ?

## 2023-03-28 MED ORDER — EZETIMIBE 10 MG PO TAB
10 mg | ORAL_TABLET | Freq: Every day | ORAL | 1 refills | Status: AC
Start: 2023-03-28 — End: ?

## 2023-03-28 MED ORDER — ASPIRIN 81 MG PO TBEC
81 mg | Freq: Every day | ORAL | 0 refills | Status: AC
Start: 2023-03-28 — End: ?

## 2023-03-28 NOTE — Patient Instructions
CARDIAC CATHETERIZATION   PRE-ADMISSION INSTRUCTIONS    Patient Name: Traci Wade  MRN#: [id number]  Date of Birth: April 18, 1965 (58 y.o.)  Today's Date: 03/28/2023    PROCEDURE:  You are scheduled for a Coronary Angiogram with possible Angioplasty/Stenting with Dr. Idamae Lusher.    PROCEDURE DATE AND ARRIVAL TIME:  Your procedure date is 04/11/23.  You will receive a call from the Cath lab staff between 8:00 a.m. and noon on the business day prior to your procedure to let you know at what time to arrive on the day of your procedure.    Please check in at the Admitting Desk in the [facility] for your procedure.   Address:  [address], [place], [place] [address]    [facility] Entrance and and take a right. Continue down the hallway past the Cardiovascular Medicine office. That hall will take [facility] into [facility]. Check in at the desk on the left side.)     (If you have further questions regarding your arrival time for the CV lab, please call [phone] by 3:00pm the day before your procedure. Please leave a message with your name and number, your call will be returned in a timely manner.)    PRE-PROCEDURE APPOINTMENTS:               Pre-Admission lab work required within 14 days of procedure: BMP, CBC, Fasting Lipids, and lipoprotien a  at the lab of your choice.         FOOD AND DRINK INSTRUCTIONS  Nothing to eat after midnight before your procedure. No caffeine for 24 hours prior to your procedure. You will be under moderate sedation for your procedure.  You may drink clear liquids up to an hour before hospital arrival. This will be confirmed by the Cath lab staff the day before your procedure.     SPECIAL MEDICATION INSTRUCTIONS  Any new prescriptions will be sent to your pharmacy listed on file with [place].        Please either take 4 baby aspirins (4 times 81mg ) or one full strength NON-COATED 325mg  aspirin.        TAKE AM OF PROCEDURE:          HOLD ALL erectile dysfunction medications for 3 days, unless prescribed for pulmonary hypertension.  HOLD ALL over the counter vitamins or supplements on the morning of your procedure.      Additional Instructions  If you wear CPAP, please bring your mask and machine with you to the hospital.    Take a bath or shower with anti-bacterial soap the evening before, or the morning of the procedure.     Bring photo ID and your health insurance card(s).    Arrange for a driver to take you home from the hospital. Please arrange for a friend or family member to take you home from this test. You cannot take a Taxi, [employer] Goad, or public transportation as there has to be a responsible person to help care for you after sedation    Bring an accurate list of your current medications with you to the hospital (all medications and supplements taken daily).  Please use the medication list below and write in the date and time when you took your last dose before your procedure. Update this list of medications as needed.      Wear comfortable clothes and don't bring valuables, other than photo identification card, with you to the hospital.    Please pack  a bag for an overnight stay.     Please review your pre-procedure instructions and bring them with you on the day of your procedure.  Call the office at [phone] with any questions. You may ask to speak with Dr. Bernerd Pho nurse.        ALLERGIES  Allergies   Allergen Reactions    Morphine ANAPHYLAXIS    Celebrex [Celecoxib] RASH    Contact Metal Agent RASH     staples    Latex RASH    Penicillins RASH    Sulfa (Sulfonamide Antibiotics) HIVES    Mushroom VOMITING       CURRENT MEDICATIONS  Outpatient Encounter Medications as of 03/28/2023   Medication Sig Dispense Refill    aspirin EC (ASPIR-LOW) 81 mg tablet Take one tablet by mouth daily.      ezetimibe (ZETIA) 10 mg tablet Take one tablet by mouth daily. 90 tablet 1    lisdexamfetamine (VYVANSE) 70 mg capsule Take one capsule by mouth daily.      metoprolol succinate XL (TOPROL XL) 25 mg extended release tablet Take one tablet by mouth daily. 90 tablet 3    nitroglycerin (NITROSTAT) 0.4 mg tablet DISSOLVE ONE TABLET UNDER THE TONGUE EVERY 5 MINUTES AS NEEDED FOR CHEST PAIN.  DO NOT EXCEED A TOTAL OF 3 DOSES IN 15 MINUTES 25 tablet 0    OZEMPIC 2 mg/dose (8 mg/3 mL) injection PEN Inject two mg under the skin every 7 days.      rosuvastatin (CRESTOR) 40 mg tablet Take one tablet by mouth daily. 30 tablet 3    TALTZ AUTOINJECTOR 80 mg/mL injection pen Inject 1 mL under the skin every 30 days.       No facility-administered encounter medications on file as of 03/28/2023.       _________________________________________  Form completed by: Weston Brass  Date completed: 03/28/23  Method: In person and given to the patient.        Coronary Angiography  Angiography is a special type of moving X-ray that lets your doctor view your coronary arteries to see if the blood vessels to your heart are narrowed or blocked. This test is done when someone is having a heart attack. Or it may be done if symptoms may mean a heart attack. It also may be done after an abnormal cardiac stress test.    Before the procedure   Tell your healthcare team what medicines you take and any allergies you may have.  Tell your healthcare team if you've had a reaction to contrast dye or have had any kidney problems.  Follow any directions you are given for not eating or drinking before surgery.  A nurse will place an IV (intravenous) catheter in your vein to give fluids, and medicine to relieve pain and help you feel less anxious.  They clean your skin and shave the area where the catheter will be inserted, if needed.    During the procedure   You will lie on a table with a portable X-ray machine over you. The team will place a surgical drape over your body. The area where the doctor chooses to insert the catheter will be cleaned. This will be either a wrist or the groin.  Your doctor will place a long, thin tube (catheter) inside an artery in your groin or arm and guide it into your heart. You may feel pressure with the insertion of the catheter. A numbing medicine often is injected at the insertion  site. This eases discomfort during the procedure.  They will inject a contrast dye through the catheter into your blood vessels or heart chambers. You may feel a warm sensation or feeling like you have to urinate when the contrast is injected. This is normal.  X-rays are taken to show images of the inside of your heart and coronary arteries.     The catheter can be placed into the groin, arm, or wrist.     After the procedure   Your healthcare team will tell you how long to lie down and keep the insertion site still. The amount of time may depend on whether a closure device such as a stitch or collagen plug was used to close the opening made in your artery. The time you must be still may be shorter if one of these devices was used. The amount of time will also depend on if there is any bleeding at the catheter insertion site.  If the insertion site was in your groin, you may need to lie down with your leg still for several hours. If the insertion site was in your wrist, a pressure bandage may be put on the site. Or you may have closure device placed on the insertion site. It will be taken off when there is no sign of bleeding. If bleeding occurs, a nurse will put pressure on the area to control it.  A nurse will check your blood pressure and the insertion site often. This is to make sure you remain stable after the procedure.  You may be asked to drink fluid to help flush the contrast liquid out of your system.  Have someone drive you home from the hospital.  If your doctor uses angioplasty or a stent to treat a blocked artery, you may stay the night in the hospital. If there are multiple blockages that can't be fixed with a stent or angioplasty, you may need surgery to bypass the blockages. This is called coronary artery bypass graft surgery. Your doctor will explain the results of your test and what treatment options that may be best for you.  It?s normal to find a small bruise or lump at the insertion site. The lump may be the collagen plug or stitch that you feel, or a small bruise. These common side effects should disappear within a few weeks.  You will be given instructions by your healthcare team on recovering from the coronary angiography. In general, don't lift anything heavier than a gallon of milk for several days. This gives time for the puncture site in the artery wall to heal. Try not to get the puncture site wet. Don't put it under water. Showers are OK. Don't soak in a bathtub, swimming pool, or hot tub until the skin has healed.    When to call your healthcare provider   Call your healthcare provider right away if you have any of these:   Symptoms of infection. These include pain, swelling, redness, bleeding, or drainage at the insertion site.  Fever of 100.4?F (38?C) or higher, or as advised by your provider  Bleeding, bruising, or a lot of swelling where the catheter was inserted  Blood in your urine  Black or tarry stools  Any unusual bleeding  Irregular, very slow, or fast heartbeat  Dizziness  Call 911  Call 911if any of these occur:     Chest pain  Shortness of breath  Sudden numbness or weakness in arms, legs, or face, or difficulty speaking  The  puncture site swells up very fast  Bleeding from the puncture site that does not slow down with firm pressure  Severe or increasing pain, numbness, coldness, or a bluish color in the leg or arm that held the catheter    [employer] last reviewed this educational content on 01/31/2021  ? 2000-2023 [employer], [place]. All rights reserved. This information is not intended as a substitute for professional medical care. Always follow your healthcare professional's instructions.

## 2023-03-28 NOTE — Progress Notes
Cardiovascular Medicine       Date of Service: 03/28/2023      HPI     Traci Wade is a 58 y.o. female who was seen today in the Cardiovascular Medicine Clinic at [facility] at our [place] office.     Her past medical history includes coronary artery disease s/p PCI to LAD in 2019, hypertension, severe hypertriglyceridemia, diabetes, history of COVID infection, ADHD.   She  presents today for evaluation of chest pain.    She states for the past several months, she has been noticing worsening chest pain, particularly with exertion.  About 2 weeks ago, she was woken up in the middle of the night with crushing chest pain that was similar to the pain she had prior to her PCI in 2019.  She also has left arm tingling and numbness that is similar to her presentation prior to PCI.  She reports worsening palpitations, exertional dyspnea and decreased exercise tolerance over the past few months as well.  She works as a Engineer, civil (consulting) in [place] [employer] and states her job is quite stressful.  She has also been unable to take her medications as prescribed because of her stressful schedule.  She denies any lightheadedness but does state that she has a headache.    Of note, she had a regadenoson stress MPI prior to her LAD PCI in 2019 that showed a small apical anteroseptal defect.  On subsequent coronary angiogram, she was found to have 99% stenosis of the mid LAD.  The same coronary angiogram showed a 50% stenosis of the mid circumflex.  She has had 2 subsequent regadenoson stress MPI's in 2020 and 2022 which both showed a similar perfusion defect in the distal anteroseptal region.  This was attributed to possibly small vessel ischemia versus soft tissue attenuation.    She also has severe hypertriglyceridemia, several values above 3000 in the past that she attributes to pancreatitis.  Her most recent lipid panel that was performed in November of last year had triglycerides of 530.  The most recent assessment of her LDL was in 2021 when it was 105.  She is currently only on rosuvastatin, she has not been taking her fenofibrate, Vascepa and does not recall ever being on ezetimibe.  She states that her PCP is considering an injectable lipid-lowering medication.  She is also stopped taking aspirin, she states that she took aspirin and ticagrelor for a year post her PCI but was told by one of her physicians (she is unable to recall who) to stop taking the aspirin as well.  She states that she has been off aspirin for at least a few years.  She was previously on metoprolol but states that she stopped taking it due to hypotension.  Her blood pressure in clinic today is elevated.  She also admits to consuming an excessive amount of caffeine, states she drinks up to her case of Diet Coke or diet Pepsi per day.  She does not consume much water.  She denies use of alcohol and quit smoking in 2000.             ECG: Normal sinus rhythm    Transthoracic echocardiogram: 11/20/2019:  The left ventricular size is normal. The left ventricular wall thickness is normal. Concentric remodeling. The left ventricular systolic function is hyperdynamic. The visually estimated ejection fraction is 75%. There are no segmental wall motion abnormalities.  The right ventricular size, wall thickness and systolic function are  normal.  Normal biatrial size.  Vavles are structurally normal without significant regurgitation.  Estimated Peak Systolic PA Pressure 22 mmHg  No pericardial effusion.  Compared with study dated 05/21/18, no significant change is noted.    Stress test: 05/2021  This study is probably normal with no evidence of significant myocardial ischemia. There was a very small sized very mild intensity perfusion abnormality appreciated distal anteroseptal territory. However, on gated imaging this segment thickens and brightens appropriately. Moreover, there is some breast tissue attenuation in this territory. While there is suspicion that this is a consequence of soft tissue damage artifact cannot rule out a limb ischemia and a septal branch distribution. Left ventricular systolic function is normal. There are no high risk prognostic indicators present. The pharmacologic ECG portion of the study is negative for ischemia.     Coronary angiogram/PCI 05/2018:  SELECTIVE CORONARY ANGIOGRAPHY:  LEFT MAIN CORONARY ARTERY:  The left main coronary artery is a medium caliber vessel which distally bifurcates into LAD and circumflex, and is free of angiographically significant disease.      LAD:  The LAD is a medium caliber vessel, which gives off 1 diagonal branch.  The diagonal branch is occluded proximally.  The LAD has a focal area of 99% stenosis in its midportion.  This is bordered by 60% to 70% stenosis as well.  The remainder of the vessel has only mild luminal irregularities.      LEFT CIRCUMFLEX:  The circumflex is a medium caliber vessel which gives off 1 small high obtuse marginal branch, which has mild plaquing.  The midportion of the circumflex has a diffuse area of 50% stenosis.  The remainder of the vessel has mild luminal irregularities.      RIGHT CORONARY ARTERY:  The right coronary artery is a medium caliber vessel, which is dominant.  It has diffuse mild plaquing present.  It has an anterior takeoff.     INTERVENTIONAL PORTION OF PROCEDURE:  The catheter was exchanged for a JL3.5 guide catheter which was advanced to the left main coronary ostium.  Initial ACT was 210 after 5000 units of heparin.  An additional 2000 was given at that time.  A Luge wire was then advanced to the distal portion of the LAD.  The lesion was pre-dilated with a 2.0 compliant balloon.  A 3.0 x 28 Xience DES was then placed in the midportion of the LAD, completely covering the diseased portion.  It was deployed at high pressure.  A 3.25 noncompliant balloon was used to perform high-pressure postdilatation throughout the stented area.  There was an excellent angiographic result.  TIMI grade 3 flow was maintained throughout the procedure and at its conclusion.  There was no evidence of edge dissection or perforation and 0% residual stenosis.          Assessment & Plan   58 y.o. female patient with the following medical problems:    Coronary artery disease.  Hypertension.  Hyperlipidemia.  Severe hypertriglyceridemia.  Diabetes.  ADHD.  Angina pectoris.    Given her significant risk factors including diabetes, significant lipid derangements, hypertension and nonadherence to therapy, her symptoms are concerning for obstructive coronary artery disease.  She continues to have chest pain despite having a normal regadenoson stress MPI a year and a half ago.  Of note, her stress MPI prior to LAD PCI was low risk as well.  Given this, recommend proceeding with a coronary angiogram given that her clinical presentation is concerning for angina.  She does need aggressive risk factor modification.  Counseled her regarding importance of adherence to guideline directed medical therapy.  She will reach out to pharmacy for assistance with pill packing to aid in compliance.  Will restart her on aspirin 81 mg daily, metoprolol succinate 25 mg daily, ezetimibe 10 mg daily.  Referral to lipid clinic.  Will repeat lipid panel and lipoprotein a.  She may benefit from an injectable PCSK9 inhibitor.  Counseled regarding cessation from excessive caffeine.      Return to clinic in 3 months         Past Medical History  Patient Active Problem List    Diagnosis Date Noted    Hypertriglyceridemia 06/09/2019    Coronary artery disease of native artery of native heart with stable angina pectoris [facility]) 06/12/2018     06/12/2018 - cath with high grade stenosis in the mid left anterior descending artery status post percutaneous coronary intervention with drug-eluting stent x1.  Initiated on aspirin and Brilinta      Type 2 diabetes mellitus with ophthalmic complication (HCC) 06/12/2018 Dyslipidemia, goal LDL below 70 06/12/2018     Lab Results   Component Value Date    CHOL 295 06/12/2018    TRIG 503 06/12/2018    HDL 44 06/12/2018    LDL 164 06/12/2018    VLDL 101 06/12/2018   Started on Crestor 20 mg daily       Coronary artery disease 06/12/2018    Chest pain 05/29/2018     05/21/2018 MPI  SUMMARY/OPINION:  1. Mildly abnormal study.  Small reversible/ischemic apical anteroseptal defect (1-2 segments).  With associated mild post-rest hypokinesis. Preserved apical and remaining myocardium uptake.  2. Low risk features for annual cardiovascular mortality rate include: Summed stress score of 2, no transient ischemic dilatation, normal lung to heart ratio, normal ejection fraction of 60%.  3. Nonischemic ECG response to infusion.    05/21/2018 echo: No regional wall motion abnormalities are seen. Overall LV systolic function appears normal. The estimated left ventricular ejection fraction is 60%. There is mild concentric left ventricular hypertrophy. Normal left ventricular diastolic function.  Right ventricular contractility appears normal.  Normal chamber dimensions.  There is no evidence of significant valvular regurgitation or stenosis by doppler exam.  No pericardial effusion is seen.    06/19/2011  LHC:  Normal coronary arteries and LV function.            Hypertension 05/29/2018    Peripheral vascular disease (HCC) 05/29/2018     05/03/2015  Carotid ultrasound:  Minimal atherosclerotic changes without evident hemodynamically significant plaque or stenosis.          I reviewed and confirmed this patient's problem list, active medications, allergies, and past medical, social, family & tobacco histories.     Review of Systems  14 point review of systems negative except as above.    Vitals:    03/28/23 0824   BP: (!) 146/98   BP Source: Arm, Left Upper   Pulse: 76   SpO2: 98%   O2 Device: None (Room air)   PainSc: Five   Weight: 76.7 kg (169 lb)   Height: 167.6 cm (5' 6)     Body mass index is 27.28 kg/m?Marland Kitchen     Physical Exam  General Appearance: no acute distress  HEENT: EOMI, mucous membranes moist, oropharynx is clear  Neck Veins: neck veins are flat & not distended  Carotid Arteries: no bruits  Chest Inspection: chest is normal in appearance  Auscultation/Percussion: lungs clear to auscultation, no rales, rhonchi, or wheezing  Cardiac Rhythm: regular rhythm & normal rate  Cardiac Auscultation: Normal S1 & S2, no S3 or S4, no rub  Murmurs: no cardiac murmurs  Abdominal Exam: soft, non-tender, normal bowel sounds, no masses or bruits  Abdominal aorta: nonpalpable   Liver & Spleen: no organomegaly  Extremities: no lower extremity edema; palpable distal pulses  Skin: warm & intact  Neurologic Exam: oriented to time, place and person; no focal neurologic deficits       Cardiovascular Studies  11/18/19   2D + DOPPLER ECHO   Result Value Ref Range    IVS 0.86 0.6 - 0.9 cm    LVIDD 4.08 3.8 - 5.2 cm    LVIDS 2.43 2.2 - 3.5 cm    PW 1.05 0.6 - 0.9 cm    Left Ventricle Diastolic Volume 74.00 46 - 106 mL    Left Ventricle Diastolic Volume Index 38 29 - 61 mL/m2    Left Ventricle Systolic Volume 29.00 14 - 42 mL    Left Ventricle Systolic Volume Index 15 8 - 24 mL/m2    TDI lateral e' 0.08 m/s    Right Ventricular Mid Diameter 3.02 1.9 - 3.5 cm    LA size 3.67 2.7 - 3.8 cm    LA volume 34.31 22 - 52 mL    Right Atrial Area 13.53 <18 cm2    Right Atrial Major Dimension 5.29 2.2 - 2.8 cm    AV peak velocity 1.64 m/s    MV Peak A Vel 1.06 m/s    MV Peak E Vel PW 0.56 m/s    Right Ventricular Basal Diameter 3.16 2.5 - 4.1 cm    Right Heart Systolic TDI S' 0.17 m/s    Right Heart Systolic Mmode TAPSE 1.65 >1.7 cm    Sinus 3.03 2.4 - 3.6 cm    Ascending aorta 2.54 cm    BSA 1.93 m2    Referring Provider Dedra Skeens     FS 40.44 28 - 44 %    Teichholtz 68.75 %    LV mass 123 67 - 162 g    RWT 0.51 <=0.42    E/A ratio 0.53     TV rest pulmonary artery pressure 22 mmHg    Lateral E/E' ratio 7.00     Left Atrium Index 17.78 16 - 34 mL/m2    Cardiology Ultrasound Machine Siemens SC2000     Left Ventricle Mass Index 64 43 - 95 g/m2    TDI Medial e' 0.087 m/s    Medial E/E' ratio 6.44     TR PEAK VELOCITY 2.2 m/s    RV SYSTOLIC PRESSURE 19     RA PRESSURE 3     ECHO EF 75 %        Cardiovascular Health Factors  Vitals BP Readings from Last 3 Encounters:   03/28/23 (!) 146/98   02/28/21 128/88   11/18/19 (!) 140/84     Wt Readings from Last 3 Encounters:   03/28/23 76.7 kg (169 lb)   02/28/21 81.9 kg (180 lb 9.6 oz)   11/18/19 79.8 kg (176 lb)     BMI Readings from Last 3 Encounters:   03/28/23 27.28 kg/m?   02/28/21 29.15 kg/m?   11/18/19 28.41 kg/m?      Smoking Social History     Tobacco Use   Smoking Status Former    Types: Cigarettes   Smokeless Tobacco Never  Lipid Profile Cholesterol   Date Value Ref Range Status   11/01/2022 332 (H) <200 Final     HDL   Date Value Ref Range Status   11/01/2022 49  Final     LDL   Date Value Ref Range Status   11/01/2022 See comment  Final     Comment:     Unable to calculate LDL due to triglyceride result > 400 mg/dL     Triglycerides   Date Value Ref Range Status   11/01/2022 531 (H) <150 Final      Blood Sugar Hemoglobin A1C   Date Value Ref Range Status   11/06/2022 7.3 (H) 4.5 - 6.5 Final     Glucose   Date Value Ref Range Status   12/15/2020 305  Final   06/12/2019 145 (H) 70 - 100 MG/DL Final   16/10/9603 540 (H) 70 - 100 MG/DL Final     Comment:     Due to high levels of Lipemia, testing has been performed on an ultrafuged   sample.       Glucose, POC   Date Value Ref Range Status   06/12/2019 291 (H) 70 - 100 MG/DL Final   98/10/9146 829 (H) 70 - 100 MG/DL Final   56/21/3086 578 (H) 70 - 100 MG/DL Final        ASCVD Risk Assessment:     ASCVD 10-year risk calculated: The ASCVD Risk score (Arnett DK, et al., 2019) failed to calculate for the following reasons:    The valid total cholesterol range is 130 to 320 mg/dL     LDL 46-962, if ASCVD 10-y risk is >7.5%, high to moderate-intensity statin therapy is recommended  Diabetes with ASCVD 10-y risk >7.5%, high-intensity statin therapy is recommended.  Diabetes with ASCVD 10-y risk <7.5%, moderate-intensity statin therapy is recommended.      Current Medications (including today's revisions)   lisdexamfetamine (VYVANSE) 70 mg capsule Take one capsule by mouth daily.    nitroglycerin (NITROSTAT) 0.4 mg tablet DISSOLVE ONE TABLET UNDER THE TONGUE EVERY 5 MINUTES AS NEEDED FOR CHEST PAIN.  DO NOT EXCEED A TOTAL OF 3 DOSES IN 15 MINUTES    OZEMPIC 2 mg/dose (8 mg/3 mL) injection PEN Inject two mg under the skin every 7 days.    rosuvastatin (CRESTOR) 40 mg tablet Take one tablet by mouth daily.    TALTZ AUTOINJECTOR 80 mg/mL injection pen Inject 1 mL under the skin every 30 days.         Orpah Cobb MD  Cardiovascular Medicine.

## 2023-03-29 ENCOUNTER — Encounter: Admit: 2023-03-29 | Discharge: 2023-03-29 | Payer: BC Managed Care – PPO

## 2023-03-29 DIAGNOSIS — Z1329 Encounter for screening for other suspected endocrine disorder: Secondary | ICD-10-CM

## 2023-03-29 DIAGNOSIS — E559 Vitamin D deficiency, unspecified: Secondary | ICD-10-CM

## 2023-03-29 DIAGNOSIS — E7800 Pure hypercholesterolemia, unspecified: Secondary | ICD-10-CM

## 2023-03-29 NOTE — Progress Notes
Web site with [place], confirmed benefits and eligibility:  Current and active since 12/31/20, $1500 deductible with required co-insurance of 20% to max OOP $4500, then plan will pay 100% of allowable charges.    Per [employer] site prior Josem Kaufmann is required for both LV Cors [id number], and poss PCI [phone] through [place] initiated for LV Cors [phone] through [employer] site. Case is PENDING at this time. Clinicals have been faxed for review to [phone]  CASE # [id number]               IF POSSIBLE PCI [id number] IS RENDERED AT THE TIME OF SERVICE, RETRO AUTH WILL BE REQUIRED.

## 2023-04-01 ENCOUNTER — Encounter: Admit: 2023-04-01 | Discharge: 2023-04-01 | Payer: BC Managed Care – PPO

## 2023-04-09 ENCOUNTER — Encounter: Admit: 2023-04-09 | Discharge: 2023-04-09 | Payer: BC Managed Care – PPO

## 2023-04-09 NOTE — Progress Notes
[employer] denied original attempt for prior authorization of LVCORS stating not medically necessary.  In follow up call, team was told that patient's plan allowed for another precert request attempt.  I called [employer] [phone] and spoke with Tyra J in clinical construction who created and new case and reviewed the verbal survey regarding case.  Patient did not meet initial criteria and case would pend for medical team review.  I requested transfer to medical review team and was transferred to reviewing nurse, Nicki who discussed case with me. After discussion and review, Nicki agreed that case meets clinical criteria and was able to approve LVCORS [id number] from her level.  Authorization is valid for a single date of service between 04/09/2023 - 05/08/2023.  Authorization #[id number]

## 2023-04-11 ENCOUNTER — Encounter: Admit: 2023-04-11 | Discharge: 2023-04-11 | Payer: BC Managed Care – PPO

## 2023-04-11 DIAGNOSIS — E785 Hyperlipidemia, unspecified: Secondary | ICD-10-CM

## 2023-04-11 DIAGNOSIS — R079 Chest pain, unspecified: Secondary | ICD-10-CM

## 2023-04-11 DIAGNOSIS — E119 Type 2 diabetes mellitus without complications: Secondary | ICD-10-CM

## 2023-04-11 DIAGNOSIS — I10 Essential (primary) hypertension: Secondary | ICD-10-CM

## 2023-04-11 DIAGNOSIS — E1139 Type 2 diabetes mellitus with other diabetic ophthalmic complication: Secondary | ICD-10-CM

## 2023-04-11 DIAGNOSIS — G459 Transient cerebral ischemic attack, unspecified: Secondary | ICD-10-CM

## 2023-04-11 DIAGNOSIS — R609 Edema, unspecified: Secondary | ICD-10-CM

## 2023-04-11 DIAGNOSIS — R55 Syncope and collapse: Secondary | ICD-10-CM

## 2023-04-11 DIAGNOSIS — I25118 Atherosclerotic heart disease of native coronary artery with other forms of angina pectoris: Secondary | ICD-10-CM

## 2023-04-11 MED ADMIN — PERFLUTREN LIPID MICROSPHERES 1.1 MG/ML IV SUSP [79178]: 1 mL | INTRAVENOUS | @ 20:00:00 | Stop: 2023-04-11 | NDC 11994001116

## 2023-04-11 MED ADMIN — SODIUM CHLORIDE 0.9 % IV SOLP [27838]: 500 mL | INTRAVENOUS | @ 15:00:00 | Stop: 2023-04-11 | NDC 00338004904

## 2023-04-11 MED ADMIN — SODIUM CHLORIDE 0.9 % IJ SOLN [7319]: 50 mL | INTRAVENOUS | @ 20:00:00 | Stop: 2023-04-11 | NDC 00409488820

## 2023-04-11 MED ADMIN — IOHEXOL 350 MG IODINE/ML IV SOLN [81210]: 75 mL | INTRAVENOUS | @ 20:00:00 | Stop: 2023-04-11 | NDC 00407141491

## 2023-04-11 MED ADMIN — SODIUM CHLORIDE 0.9 % IJ SOLN [7319]: 10 mL | INTRAVENOUS | @ 20:00:00 | Stop: 2023-04-11 | NDC 00409488820

## 2023-05-14 ENCOUNTER — Encounter: Admit: 2023-05-14 | Discharge: 2023-05-14 | Payer: BC Managed Care – PPO

## 2023-05-22 ENCOUNTER — Encounter: Admit: 2023-05-22 | Discharge: 2023-05-22 | Payer: BC Managed Care – PPO

## 2023-05-24 ENCOUNTER — Encounter: Admit: 2023-05-24 | Discharge: 2023-05-24 | Payer: BC Managed Care – PPO

## 2023-05-24 DIAGNOSIS — R0989 Other specified symptoms and signs involving the circulatory and respiratory systems: Secondary | ICD-10-CM

## 2023-05-24 DIAGNOSIS — I2089 Stable angina pectoris (HCC): Secondary | ICD-10-CM

## 2023-05-24 DIAGNOSIS — E781 Pure hyperglyceridemia: Secondary | ICD-10-CM

## 2023-05-24 DIAGNOSIS — E7800 Pure hypercholesterolemia, unspecified: Secondary | ICD-10-CM

## 2023-05-24 DIAGNOSIS — Z1329 Encounter for screening for other suspected endocrine disorder: Secondary | ICD-10-CM

## 2023-05-24 DIAGNOSIS — I739 Peripheral vascular disease, unspecified: Secondary | ICD-10-CM

## 2023-05-24 DIAGNOSIS — E559 Vitamin D deficiency, unspecified: Secondary | ICD-10-CM

## 2023-05-24 DIAGNOSIS — I25118 Atherosclerotic heart disease of native coronary artery with other forms of angina pectoris: Secondary | ICD-10-CM

## 2023-05-24 DIAGNOSIS — I10 Essential (primary) hypertension: Secondary | ICD-10-CM

## 2023-05-29 ENCOUNTER — Encounter: Admit: 2023-05-29 | Discharge: 2023-05-29 | Payer: BC Managed Care – PPO

## 2023-05-29 DIAGNOSIS — E785 Hyperlipidemia, unspecified: Secondary | ICD-10-CM

## 2023-05-29 NOTE — Telephone Encounter
-----   Message from Terence Lux, MD sent at 05/29/2023  9:08 AM CDT -----  Will discuss with her at OV. Please place referral to lipid clinic.     Thanks  SWathi  ----- Message -----  From: Rogelia Boga, RN  Sent: 05/27/2023   8:31 AM CDT  To: Orpah Cobb, MD    Labs for your review.  [facility] has follow up with you on 5/30.  Please let me know if you have any recommendations prior to her upcoming appt.  Thank you!

## 2023-05-29 NOTE — Telephone Encounter
Placed referral to lipid clinic as requested.

## 2023-05-30 ENCOUNTER — Encounter: Admit: 2023-05-30 | Discharge: 2023-05-30 | Payer: BC Managed Care – PPO

## 2023-05-30 DIAGNOSIS — E119 Type 2 diabetes mellitus without complications: Secondary | ICD-10-CM

## 2023-05-30 DIAGNOSIS — I25118 Atherosclerotic heart disease of native coronary artery with other forms of angina pectoris: Secondary | ICD-10-CM

## 2023-05-30 DIAGNOSIS — E785 Hyperlipidemia, unspecified: Secondary | ICD-10-CM

## 2023-05-30 DIAGNOSIS — R0989 Other specified symptoms and signs involving the circulatory and respiratory systems: Secondary | ICD-10-CM

## 2023-05-30 DIAGNOSIS — I2089 Stable angina pectoris (HCC): Secondary | ICD-10-CM

## 2023-05-30 DIAGNOSIS — I10 Essential (primary) hypertension: Secondary | ICD-10-CM

## 2023-05-30 DIAGNOSIS — E1139 Type 2 diabetes mellitus with other diabetic ophthalmic complication: Secondary | ICD-10-CM

## 2023-05-30 DIAGNOSIS — R079 Chest pain, unspecified: Secondary | ICD-10-CM

## 2023-05-30 DIAGNOSIS — R55 Syncope and collapse: Secondary | ICD-10-CM

## 2023-05-30 DIAGNOSIS — E781 Pure hyperglyceridemia: Secondary | ICD-10-CM

## 2023-05-30 DIAGNOSIS — G459 Transient cerebral ischemic attack, unspecified: Secondary | ICD-10-CM

## 2023-05-30 DIAGNOSIS — I739 Peripheral vascular disease, unspecified: Secondary | ICD-10-CM

## 2023-05-30 DIAGNOSIS — R609 Edema, unspecified: Secondary | ICD-10-CM

## 2023-05-30 MED ORDER — RANOLAZINE 500 MG PO TB12
500 mg | ORAL_TABLET | Freq: Two times a day (BID) | ORAL | 3 refills | Status: AC
Start: 2023-05-30 — End: ?

## 2023-05-30 MED ORDER — FENOFIBRATE NANOCRYSTALLIZED 145 MG PO TAB
145 mg | ORAL_TABLET | Freq: Every day | ORAL | 3 refills | 30.00000 days | Status: AC
Start: 2023-05-30 — End: ?

## 2023-05-30 MED ORDER — AMLODIPINE 5 MG PO TAB
5 mg | ORAL_TABLET | Freq: Every day | ORAL | 3 refills | Status: AC
Start: 2023-05-30 — End: ?

## 2023-05-30 NOTE — Progress Notes
Cardiovascular Medicine       Date of Service: 05/30/2023      HPI     Traci Wade is a 58 y.o. female who was seen today in the Cardiovascular Medicine Clinic at [facility] at our [facility] office.       Her past medical history includes coronary artery disease s/p PCI to LAD in 2019, hypertension, severe hypertriglyceridemia, diabetes, history of COVID infection, ADHD.   She  presents today follow-up.     She presented with worsening chest pain, exercise intolerance and symptoms similar to those prior to her LAD PCI in 2019.  Given this, we ordered a coronary angiogram that showed moderate disease in the PDA, RCA and posterolateral branches with negative invasive hemodynamic testing.  Her LAD stents were patent.  She has a subtotal stenosis of the diagonal which is unchanged from prior.  She also had a echocardiogram that showed normal left ventricular systolic function.  She works as a Engineer, civil (consulting) in the [employer] and states her job is quite stressful.  She has also been unable to take her medications as prescribed because of her stressful schedule.  She denies any lightheadedness but does state that she has a headache.  She attributes this to a cyst to the back of her neck.     She continues to have chest pain and today complains of worsening headaches.  She is due to see a surgeon to see if removing the cyst on her neck will help with her headaches.  She states that she tried isosorbide mononitrate in the past and this did not alleviate her chest pain.    She had a regadenoson stress MPI prior to her LAD PCI in 2019 that showed a small apical anteroseptal defect.  On subsequent coronary angiogram, she was found to have 99% stenosis of the mid LAD.  The same coronary angiogram showed a 50% stenosis of the mid circumflex.  She has had 2 subsequent regadenoson stress MPI's in 2020 and 2022 which both showed a similar perfusion defect in the distal anteroseptal region.  This was attributed to possibly small vessel ischemia versus soft tissue attenuation.     She also has severe hypertriglyceridemia, several values above 3000 in the past that she attributes to pancreatitis.  Her most recent lipid panel that was performed in November of last year had triglycerides of 530.  The most recent assessment of her LDL was in 2021 when it was 105.  She is currently only on rosuvastatin, she has not been taking her fenofibrate, Vascepa and does not recall ever being on ezetimibe.  She states that her PCP is considering an injectable lipid-lowering medication.  She is also stopped taking aspirin, she states that she took aspirin and ticagrelor for a year post her PCI but was told by one of her physicians (she is unable to recall who) to stop taking the aspirin as well.  She states that she has been off aspirin for at least a few years.  She was previously on metoprolol but states that she stopped taking it due to hypotension.  Her blood pressure in clinic today is elevated.  She also admits to consuming an excessive amount of caffeine, states she drinks up to her case of Diet Coke or diet Pepsi per day.  She does not consume much water.  She denies use of alcohol and quit smoking in 2000.  Transthoracic echocardiogram: 04/2023:  LVEF=60%.  Normal left ventricular size and systolic function.  No regional wall motion abnormalities.  Right ventricular size and function are normal.  Normal LA volume index.  Normal RA area.   No hemodynamically significant valve abnormalities.   No pericardial effusion.  Normal central venous pressure (0-5 mm Hg).  Unable to accurately estimate PA systolic pressure.      Compared to the echocardiogram completed on 11/18/2019, the left ventricular size and systolic function remain normal with no new regional wall motion abnormalities.  The previous pulmonary artery systolic pressure was estimated at 22 mmHg.          Coronary angiogram 04/2023:  Moderate coronary atherosclerosis manifested by the following  50% mid right coronary artery stenosis with a 50-60% mid posterolateral stenosis with an RFR of 0.95.  50-60% stenosis in the midportion of a small caliber posterior descending artery.  Widely patent stents in the mid LAD.  Subtotal stenosis of the 1st diagonal branch, unchanged from the prior angiogram.  Normal left ventricular end-diastolic pressures.      Assessment & Plan   58 y.o. female patient with the following medical problems:    Coronary artery disease.  Hypertension.  Hyperlipidemia.  Severe hypertriglyceridemia.  Diabetes.  ADHD.  Angina pectoris.     Given moderate coronary artery disease and subtotal occlusion of diagonal artery, will maximize antianginal therapy.  Will start ranolazine 500 mg twice daily, amlodipine 5 mg daily.  She already takes metoprolol.  She does need aggressive risk factor modification.  Continue rosuvastatin and ezetimibe, will add fenofibrate today.  Referral to lipid clinic placed.  Counseled her regarding importance of adherence to guideline directed medical therapy.  She will reach out to pharmacy for assistance with pill packing to aid in compliance.  Continue aspirin 81 mg daily, metoprolol succinate 25 mg daily.          Return to clinic in 6 months.           Past Medical History  Patient Active Problem List    Diagnosis Date Noted    Hypertriglyceridemia 06/09/2019    Coronary artery disease of native artery of native heart with stable angina pectoris [facility]) 06/12/2018     06/12/2018 - cath with high grade stenosis in the mid left anterior descending artery status post percutaneous coronary intervention with drug-eluting stent x1.  Initiated on aspirin and Brilinta      Type 2 diabetes mellitus with ophthalmic complication (HCC) 06/12/2018    Dyslipidemia, goal LDL below 70 06/12/2018     Lab Results   Component Value Date    CHOL 295 06/12/2018    TRIG 503 06/12/2018    HDL 44 06/12/2018    LDL 164 06/12/2018    VLDL 101 06/12/2018   Started on Crestor 20 mg daily       Coronary artery disease 06/12/2018    Chest pain 05/29/2018     05/21/2018 MPI  SUMMARY/OPINION:  1. Mildly abnormal study.  Small reversible/ischemic apical anteroseptal defect (1-2 segments).  With associated mild post-rest hypokinesis. Preserved apical and remaining myocardium uptake.  2. Low risk features for annual cardiovascular mortality rate include: Summed stress score of 2, no transient ischemic dilatation, normal lung to heart ratio, normal ejection fraction of 60%.  3. Nonischemic ECG response to infusion.    05/21/2018 echo: No regional wall motion abnormalities are seen. Overall LV systolic function appears normal. The estimated left ventricular ejection fraction is 60%. There is  mild concentric left ventricular hypertrophy. Normal left ventricular diastolic function.  Right ventricular contractility appears normal.  Normal chamber dimensions.  There is no evidence of significant valvular regurgitation or stenosis by doppler exam.  No pericardial effusion is seen.    06/19/2011  LHC:  Normal coronary arteries and LV function.            Hypertension 05/29/2018    Peripheral vascular disease (HCC) 05/29/2018     05/03/2015  Carotid ultrasound:  Minimal atherosclerotic changes without evident hemodynamically significant plaque or stenosis.          I reviewed and confirmed this patient's problem list, active medications, allergies, and past medical, social, family & tobacco histories.     Review of Systems  14 point review of systems negative except as above.    Vitals:    05/30/23 1001   BP: (!) 144/93   BP Source: Arm, Left Upper   Pulse: 79   SpO2: 96%   O2 Device: None (Room air)   PainSc: Seven   Weight: 76.8 kg (169 lb 6.4 oz)   Height: 165.1 cm (5' 5)     Body mass index is 28.19 kg/m?Marland Kitchen     Physical Exam  General Appearance: no acute distress  HEENT: EOMI, mucous membranes moist, oropharynx is clear  Neck Veins: neck veins are flat & not distended  Carotid Arteries: no bruits  Chest Inspection: chest is normal in appearance  Auscultation/Percussion: lungs clear to auscultation, no rales, rhonchi, or wheezing  Cardiac Rhythm: regular rhythm & normal rate  Cardiac Auscultation: Normal S1 & S2, no S3 or S4, no rub  Murmurs: no cardiac murmurs  Abdominal Exam: soft, non-tender, normal bowel sounds, no masses or bruits  Abdominal aorta: nonpalpable   Liver & Spleen: no organomegaly  Extremities: no lower extremity edema; palpable distal pulses  Skin: warm & intact  Neurologic Exam: oriented to time, place and person; no focal neurologic deficits       Cardiovascular Studies  04/11/23   2D + DOPPLER ECHO   Result Value Ref Range    Left Ventricle Diastolic Volume 83.00 46 - 106 mL    Left Ventricle Systolic Volume 32.00 14 - 42 mL    IVS 0.9 0.6 - 0.9 cm    LVIDD 4.40 3.8 - 5.2 cm    LVIDS 2.90 2.2 - 3.5 cm    LVOT diameter 2.10 cm    LVOT peak VTI 23.50 cm    PW 0.9 0.6 - 0.9 cm    TDI lateral e' 0.06 m/s    TDI Medial e' 0.05 m/s    LA volume 42.10 22 - 52 mL    LA size 4.10 2.7 - 3.8 cm    AV peak velocity 1.40 m/s    Sinus 3.00 2.4 - 3.6 cm    MV Peak A Vel 0.94 m/s    MV Peak E Vel PW 0.63 m/s    Proximal aorta 3.40 1.9 - 3.5 cm    Right Heart Systolic Mmode TAPSE 1.71 >1.7 cm    Right Ventricular Mid Diameter 2.20 1.9 - 3.5 cm    Right Ventricular Basal Diameter 3.10 2.5 - 4.1 cm    Right Atrial Area 10.60 <18 cm2    Right Heart Systolic TDI S' 0.11 m/s    BSA 1.87 m2    FS 34.09 28 - 44 %    Teichholtz 57.95 %    Left Ventricle Systolic Volume Index 17 8 -  24 mL/m2    Left Ventricle Diastolic Volume Index 44 29 - 61 mL/m2    Left Atrium Index 22.51 16 - 34 mL/m2    LV mass 128 67 - 162 g    Left Ventricle Mass Index 68 43 - 95 g/m2    RWT 0.41 <=0.42    LVOT area 3.46 cm2    LVOT stroke volume 81.39 cm3    E/A ratio 0.67     Medial E/E' ratio 12.60     Lateral E/E' ratio 10.50     Cardiology Ultrasound Machine [employer] Epiq     RA PRESSURE 3     SIMPSON'S BIPLANE EF 61 %    ECHO EF 60 %        Cardiovascular Health Factors  Vitals BP Readings from Last 3 Encounters:   05/30/23 (!) 144/93   04/11/23 (!) 165/96   03/28/23 (!) 146/98     Wt Readings from Last 3 Encounters:   05/30/23 76.8 kg (169 lb 6.4 oz)   04/11/23 76.4 kg (168 lb 6.9 oz)   03/28/23 76.7 kg (169 lb)     BMI Readings from Last 3 Encounters:   05/30/23 28.19 kg/m?   04/11/23 28.03 kg/m?   03/28/23 27.28 kg/m?      Smoking Social History     Tobacco Use   Smoking Status Former    Types: Cigarettes   Smokeless Tobacco Never      Lipid Profile Cholesterol   Date Value Ref Range Status   04/03/2023 288 (H) <200 Final     HDL   Date Value Ref Range Status   04/03/2023 47  Final     LDL   Date Value Ref Range Status   04/03/2023 (A)  Final    Unable to calculate due to triglyceride result >400 mg/dL     Triglycerides   Date Value Ref Range Status   04/03/2023 536 (H) <150 Final      Blood Sugar Hemoglobin A1C   Date Value Ref Range Status   11/06/2022 7.3 (H) 4.5 - 6.5 Final     Glucose   Date Value Ref Range Status   04/03/2023 155 (H) 70 - 105 Final   12/15/2020 305  Final   06/12/2019 145 (H) 70 - 100 MG/DL Final     Glucose, POC   Date Value Ref Range Status   06/12/2019 291 (H) 70 - 100 MG/DL Final   82/95/6213 086 (H) 70 - 100 MG/DL Final   57/84/6962 952 (H) 70 - 100 MG/DL Final        ASCVD Risk Assessment:     ASCVD 10-year risk calculated: The 10-year ASCVD risk score (Arnett DK, et al., 2019) is: 13.2%*    Values used to calculate the score:      Age: 31 years      Sex: Female      Is Non-Hispanic African American: No      Diabetic: Yes      Tobacco smoker: No      Systolic Blood Pressure: 144 mmHg      Is BP treated: Yes      HDL Cholesterol: 47 mg/dL*      Total Cholesterol: 288 mg/dL*      * - Cholesterol units were assumed for this score calculation     LDL 70-189, if ASCVD 10-y risk is >7.5%, high to moderate-intensity statin therapy is recommended  Diabetes with ASCVD 10-y risk >7.5%, high-intensity statin therapy is  recommended.  Diabetes with ASCVD 10-y risk <7.5%, moderate-intensity statin therapy is recommended.      Current Medications (including today's revisions)   aspirin EC (ASPIR-LOW) 81 mg tablet Take one tablet by mouth daily.    ezetimibe (ZETIA) 10 mg tablet Take one tablet by mouth daily.    lisdexamfetamine (VYVANSE) 70 mg capsule Take one capsule by mouth daily.    metoprolol succinate XL (TOPROL XL) 25 mg extended release tablet Take one tablet by mouth daily.    nitroglycerin (NITROSTAT) 0.4 mg tablet Place 1 tab under tongue every 5 minutes as needed for Chest Pain (Not to exceed 3 doses /15 min. If pain persists , seek medical attention    nitroglycerin (NITROSTAT) 0.4 mg tablet DISSOLVE ONE TABLET UNDER THE TONGUE EVERY 5 MINUTES AS NEEDED FOR CHEST PAIN.  DO NOT EXCEED A TOTAL OF 3 DOSES IN 15 MINUTES    OZEMPIC 2 mg/dose (8 mg/3 mL) injection PEN Inject two mg under the skin every 7 days.    rosuvastatin (CRESTOR) 40 mg tablet Take one tablet by mouth daily.    TALTZ AUTOINJECTOR 80 mg/mL injection pen Inject 1 mL under the skin every 30 days.         Orpah Cobb MD  Cardiovascular Medicine.

## 2023-10-24 ENCOUNTER — Encounter: Admit: 2023-10-24 | Discharge: 2023-10-24 | Payer: BC Managed Care – PPO

## 2023-10-24 ENCOUNTER — Ambulatory Visit: Admit: 2023-10-24 | Discharge: 2023-10-25 | Payer: BC Managed Care – PPO

## 2023-10-24 ENCOUNTER — Ambulatory Visit: Admit: 2023-10-24 | Discharge: 2023-10-24 | Payer: BC Managed Care – PPO

## 2023-10-24 DIAGNOSIS — G459 Transient cerebral ischemic attack, unspecified: Secondary | ICD-10-CM

## 2023-10-24 DIAGNOSIS — I10 Essential (primary) hypertension: Secondary | ICD-10-CM

## 2023-10-24 DIAGNOSIS — E782 Mixed hyperlipidemia: Secondary | ICD-10-CM

## 2023-10-24 DIAGNOSIS — E1139 Type 2 diabetes mellitus with other diabetic ophthalmic complication: Secondary | ICD-10-CM

## 2023-10-24 DIAGNOSIS — R55 Syncope and collapse: Secondary | ICD-10-CM

## 2023-10-24 DIAGNOSIS — I25118 Atherosclerotic heart disease of native coronary artery with other forms of angina pectoris: Secondary | ICD-10-CM

## 2023-10-24 DIAGNOSIS — E785 Hyperlipidemia, unspecified: Secondary | ICD-10-CM

## 2023-10-24 DIAGNOSIS — R609 Edema, unspecified: Secondary | ICD-10-CM

## 2023-10-24 DIAGNOSIS — R079 Chest pain, unspecified: Secondary | ICD-10-CM

## 2023-10-24 DIAGNOSIS — E119 Type 2 diabetes mellitus without complications: Secondary | ICD-10-CM

## 2023-10-24 LAB — LIPID PROFILE
CHOLESTEROL: 284 mg/dL — ABNORMAL HIGH (ref <200)
LDL: 183 mg/dL — ABNORMAL HIGH (ref <100)
NON HDL CHOLESTEROL: 227 mg/dL
TRIGLYCERIDES: 354 mg/dL — ABNORMAL HIGH (ref <150)
VLDL: 71 mg/dL

## 2023-10-25 DIAGNOSIS — I10 Essential (primary) hypertension: Secondary | ICD-10-CM

## 2023-10-25 DIAGNOSIS — I2089 Other forms of angina pectoris (HCC): Secondary | ICD-10-CM

## 2023-10-25 DIAGNOSIS — E781 Pure hyperglyceridemia: Secondary | ICD-10-CM

## 2023-10-25 DIAGNOSIS — I739 Peripheral vascular disease, unspecified: Secondary | ICD-10-CM

## 2023-10-25 DIAGNOSIS — E782 Mixed hyperlipidemia: Secondary | ICD-10-CM

## 2023-10-25 DIAGNOSIS — E785 Hyperlipidemia, unspecified: Secondary | ICD-10-CM

## 2023-10-25 DIAGNOSIS — R0989 Other specified symptoms and signs involving the circulatory and respiratory systems: Secondary | ICD-10-CM

## 2023-10-25 DIAGNOSIS — I25118 Atherosclerotic heart disease of native coronary artery with other forms of angina pectoris: Secondary | ICD-10-CM

## 2023-12-05 ENCOUNTER — Encounter: Admit: 2023-12-05 | Discharge: 2023-12-05 | Payer: BC Managed Care – PPO

## 2024-03-11 ENCOUNTER — Encounter: Admit: 2024-03-11 | Discharge: 2024-03-11 | Payer: BC Managed Care – PPO

## 2024-03-11 MED ORDER — EZETIMIBE 10 MG PO TAB
10 mg | ORAL_TABLET | Freq: Every day | ORAL | 1 refills | Status: AC
Start: 2024-03-11 — End: ?

## 2024-05-01 ENCOUNTER — Encounter: Admit: 2024-05-01 | Discharge: 2024-05-01 | Payer: BLUE CROSS/BLUE SHIELD

## 2024-05-01 DIAGNOSIS — E782 Mixed hyperlipidemia: Secondary | ICD-10-CM

## 2024-05-04 ENCOUNTER — Encounter: Admit: 2024-05-04 | Discharge: 2024-05-04 | Payer: BLUE CROSS/BLUE SHIELD

## 2024-05-04 NOTE — Telephone Encounter
 RN called pt, LVM for pt to get lipid panel

## 2024-05-26 ENCOUNTER — Encounter: Admit: 2024-05-26 | Discharge: 2024-05-26 | Payer: BLUE CROSS/BLUE SHIELD

## 2024-06-13 ENCOUNTER — Encounter: Admit: 2024-06-13 | Discharge: 2024-06-13 | Payer: BLUE CROSS/BLUE SHIELD

## 2024-06-27 ENCOUNTER — Encounter: Admit: 2024-06-27 | Discharge: 2024-06-27 | Payer: BLUE CROSS/BLUE SHIELD

## 2024-06-30 ENCOUNTER — Encounter: Admit: 2024-06-30 | Discharge: 2024-06-30 | Payer: BLUE CROSS/BLUE SHIELD

## 2024-07-27 ENCOUNTER — Encounter: Admit: 2024-07-27 | Discharge: 2024-07-27 | Payer: BLUE CROSS/BLUE SHIELD

## 2024-07-27 NOTE — Progress Notes
 Records Request    Medical records request for continuation of care:    Traci Wade, Traci Wade.   DOB:  Jul 04, 1965    Patient has appointment on 08/04/2024   with  Dr. Swathi Kovelamudi* .      **Please FAX RECORDS to Cardiovascular Medicine [facility] [phone]      **Please CLOUD IMAGING to Cardiovascular Medicine [facility]          Request records: STAT            Emergency Room Visit- (May 2025)      Cardiology Consultation (May 2025)      EKG Tracing/Strip (May 2025)            Echocardiogram (May 2025)      Carotid Duplex (May 2025)      CT Head (May 2025)      Brain MRI (May 2025)      Recent Cardiac Testing (May 2025)      Any cardiac-related records from recent hospitalization (May 2025)      Recent Labs      H&P/Discharge Summary (May 2025)                Thank you,      Cardiovascular Medicine  [facility]  [address]  [place], [place] [address]  Phone:  [phone]  Fax:  [phone]

## 2024-08-02 ENCOUNTER — Encounter: Admit: 2024-08-02 | Discharge: 2024-08-02 | Payer: BLUE CROSS/BLUE SHIELD

## 2024-08-04 ENCOUNTER — Ambulatory Visit: Admit: 2024-08-04 | Discharge: 2024-08-04 | Payer: BLUE CROSS/BLUE SHIELD

## 2024-08-04 ENCOUNTER — Encounter: Admit: 2024-08-04 | Discharge: 2024-08-04 | Payer: BLUE CROSS/BLUE SHIELD

## 2024-08-04 DIAGNOSIS — E782 Mixed hyperlipidemia: Secondary | ICD-10-CM

## 2024-08-04 DIAGNOSIS — Z136 Encounter for screening for cardiovascular disorders: Principal | ICD-10-CM

## 2024-08-04 MED ORDER — AMLODIPINE 10 MG PO TAB
10 mg | ORAL_TABLET | Freq: Every day | ORAL | 3 refills | 90.00000 days | Status: AC
Start: 2024-08-04 — End: ?

## 2024-08-04 MED ORDER — ATORVASTATIN 80 MG PO TAB
80 mg | ORAL_TABLET | Freq: Every day | ORAL | 3 refills | 90.00000 days | Status: AC
Start: 2024-08-04 — End: ?

## 2024-08-04 NOTE — Progress Notes
 Cardiovascular Medicine       Date of Service: 08/04/2024      HPI     Traci Wade is a 59 y.o. female who was seen today in the Cardiovascular Medicine Clinic at [facility] at our [place] office.     Her past medical history includes coronary artery disease s/p PCI to LAD in 2019, recent CVA, hypertension, severe hypertriglyceridemia, diabetes, history of COVID infection, ADHD.   She  presents today follow-up.       She recently presented to [facility] in May 2025 with symptoms of right arm and leg weakness.  CTA showed severe stenosis of the left posterior cerebral artery (P2 segment).  She was emergently transferred from [facility] in [place] to [facility] where she was given TNK.  She subsequently was discharged with no significant motor weakness but continues to have occasional swallowing difficulties and word finding difficulties.    Her pertinent cardiac evaluation during this hospitalization included a 48-hour Holter that did not reveal any significant arrhythmia and an echocardiogram that showed preserved left ventricular systolic function and no evidence of intracardiac shunting on injection of agitated saline.  No significant valvular abnormalities were noted.    HPI:    She presented in 2024 with worsening chest pain, exercise intolerance and symptoms similar to those prior to her LAD PCI in 2019.  Given this, we ordered a coronary angiogram that showed moderate disease in the PDA, RCA and posterolateral branches with negative invasive hemodynamic testing.  Her LAD stents were patent.  She has a subtotal stenosis of the diagonal which is unchanged from prior.  She also had a echocardiogram that showed normal left ventricular systolic function.       She had a regadenoson  stress MPI prior to her LAD PCI in 2019 that showed a small apical anteroseptal defect.  On subsequent coronary angiogram, she was found to have 99% stenosis of the mid LAD.  The same coronary angiogram showed a 50% stenosis of the mid circumflex.  She has had 2 subsequent regadenoson  stress MPI's in 2020 and 2022 which both showed a similar perfusion defect in the distal anteroseptal region.  This was attributed to possibly small vessel ischemia versus soft tissue attenuation.     She also has severe hypertriglyceridemia, several values above 3000 in the past that she attributes to pancreatitis.  We referred her to lipid clinic last year, she was seen by Dr. Marsa and is currently on atorvastatin , fenofibrate  and ezetimibe .  She has had good response with her triglycerides but her LDL still continues to be elevated, most recently was 160.  She also admits to consuming an excessive amount of caffeine, states she drinks up to her case of Diet Coke or diet Pepsi per day.  She does not consume much water.  She denies use of alcohol and quit smoking in 2000.  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                    Transthoracic echocardiogram: 04/2023:  LVEF=60%.  Normal left ventricular size and systolic function.  No regional wall motion abnormalities.  Right ventricular size and function are normal.  Normal LA volume index.  Normal RA area.   No hemodynamically significant valve abnormalities.   No pericardial effusion.  Normal central venous pressure (0-5 mm Hg).  Unable to accurately estimate PA systolic  pressure.      Compared to the echocardiogram completed on 11/18/2019, the left ventricular size and systolic function remain normal with no new regional wall motion abnormalities.  The previous pulmonary artery systolic pressure was estimated at 22 mmHg.            Coronary angiogram 04/2023:  Moderate coronary atherosclerosis manifested by the following  50% mid right coronary artery stenosis with a 50-60% mid posterolateral stenosis with an RFR of 0.95.  50-60% stenosis in the midportion of a small caliber posterior descending artery.  Widely patent stents in the mid LAD.  Subtotal stenosis of the 1st diagonal branch, unchanged from the prior angiogram.  Normal left ventricular end-diastolic pressures.        Assessment & Plan   59 y.o. female patient with the following medical problems:     Coronary artery disease.  Recent CVA.  Hypertension.  Hyperlipidemia.  Severe hypertriglyceridemia.  Diabetes.  ADHD.  Angina pectoris.     Reports no significant anginal symptoms over the past few months.  Continue ranolazine , metoprolol .  Will increase amlodipine  to 10 mg daily for better blood pressure control.  Continue ezetimibe  and Tricor , lipids well-controlled.  Will increase atorvastatin  to 80 mg daily given elevated LDL.  Continue aspirin  81 mg daily, Plavix, metoprolol  succinate 25 mg daily.           Return to clinic in 6 months with FLP.               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 Mildly abnormal study.  Small reversible/ischemic apical anteroseptal defect (1-2 segments).  With associated mild post-rest hypokinesis. Preserved apical and remaining myocardium uptake.  2. Low risk features for annual cardiovascular mortality rate include: Summed stress score of 2, no transient ischemic dilatation, normal lung to heart ratio, normal ejection fraction of 60%.  3. Nonischemic ECG response to infusion.    05/21/2018 echo: No regional wall motion abnormalities are seen. Overall LV systolic function appears normal. The estimated left ventricular ejection fraction is 60%. There is mild concentric left ventricular hypertrophy. Normal left ventricular diastolic function.  Right ventricular contractility appears normal.  Normal chamber dimensions.  There is no evidence of significant valvular regurgitation or stenosis by doppler exam.  No pericardial effusion is seen.    06/19/2011  LHC:  Normal coronary arteries and LV function.            Hypertension 05/29/2018    Peripheral vascular disease 05/29/2018  05/03/2015  Carotid ultrasound:  Minimal atherosclerotic changes without evident hemodynamically significant plaque or stenosis.          I reviewed and confirmed this patient's problem list, active medications, allergies, and past medical, social, family & tobacco histories.     Review of Systems  Review of Systems   Constitutional: Positive for malaise/fatigue and weight gain.   HENT: Negative.     Eyes: Negative.    Cardiovascular:  Positive for irregular heartbeat, leg swelling and palpitations.   Respiratory: Negative.     Endocrine: Negative.    Hematologic/Lymphatic: Negative.    Skin: Negative.    Musculoskeletal:  Positive for joint pain, muscle weakness and myalgias.   Gastrointestinal: Negative.    Genitourinary: Negative.    Neurological:  Positive for dizziness, headaches, light-headedness and weakness.   Psychiatric/Behavioral: Negative.     Allergic/Immunologic: Negative.      14 point review of systems negative except as above.    Vitals:    08/04/24 1142   BP: (!) 148/90   BP Source: Arm, Left Upper   Pulse: 98   SpO2: 100%   O2 Device: None (Room air)   PainSc: Seven   Weight: 72.2 kg (159 lb 3.2 oz)   Height: 167.6 cm (5' 6)     Body mass index is 25.7 kg/m?SABRA     Physical Exam  General Appearance: no acute distress  HEENT: EOMI, mucous membranes moist, oropharynx is clear  Neck Veins: neck veins are flat & not distended  Carotid Arteries: no bruits  Chest Inspection: chest is normal in appearance  Auscultation/Percussion: lungs clear to auscultation, no rales, rhonchi, or wheezing  Cardiac Rhythm: regular rhythm & normal rate  Cardiac Auscultation: Normal S1 & S2, no S3 or S4, no rub  Murmurs: no cardiac murmurs  Abdominal Exam: soft, non-tender, normal bowel sounds, no masses or bruits  Abdominal aorta: nonpalpable   Liver & Spleen: no organomegaly  Extremities: no lower extremity edema; palpable distal pulses  Skin: warm & intact  Neurologic Exam: oriented to time, place and person; no focal neurologic deficits       Cardiovascular Studies  04/11/23   2D + DOPPLER ECHO   Result Value Ref Range    Left Ventricle Diastolic Volume 83.00 46 - 106 mL    Left Ventricle Systolic Volume 32.00 14 - 42 mL    IVS 0.9 0.6 - 0.9 cm    LVIDD 4.40 3.8 - 5.2 cm    LVIDS 2.90 2.2 - 3.5 cm    LVOT diameter 2.10 cm    LVOT peak VTI 23.50 cm    PW 0.9 0.6 - 0.9 cm    TDI lateral e' 0.06 m/s    TDI Medial e' 0.05 m/s    LA volume 42.10 22 - 52 mL    LA size 4.10 2.7 - 3.8 cm    AV peak velocity 1.40 m/s    Sinus 3.00 2.4 - 3.6 cm    MV Peak A Vel 0.94 m/s    MV Peak E Vel PW 0.63 m/s    Proximal aorta 3.40 1.9 - 3.5 cm    Right Heart Systolic Mmode TAPSE 1.71 >1.7 cm    Right Ventricular Mid Diameter 2.20 1.9 - 3.5 cm    Right Ventricular Basal Diameter 3.10 2.5 - 4.1 cm    Right Atrial Area 10.60 <18 cm2    Right Heart Systolic TDI S' 0.11 m/s    BSA 1.87 m2  FS 34.09 28 - 44 %    Teichholtz 57.95 %    Left Ventricle Systolic Volume Index 17 8 - 24 mL/m2    Left Ventricle Diastolic Volume Index 44 29 - 61 mL/m2    Left Atrium Index 22.51 16 - 34 mL/m2    LV mass 128 67 - 162 g    Left Ventricle Mass Index 68 43 - 95 g/m2    RWT 0.41 <=0.42    LVOT area 3.46 cm2    LVOT stroke volume 81.39 cm3    E/A ratio 0.67     Medial E/E' ratio 12.60     Lateral E/E' ratio 10.50     Cardiology Ultrasound Machine [employer] Epiq     RA PRESSURE 3     SIMPSON'S BIPLANE EF 61 %    ECHO EF 60 %        Cardiovascular Health Factors  Vitals BP Readings from Last 3 Encounters:   08/04/24 (!) 148/90   10/24/23 (!) 158/88   05/30/23 (!) 144/93     Wt Readings from Last 3 Encounters:   08/04/24 72.2 kg (159 lb 3.2 oz)   10/24/23 74.8 kg (165 lb)   05/30/23 76.8 kg (169 lb 6.4 oz)     BMI Readings from Last 3 Encounters:   08/04/24 25.70 kg/m?   10/24/23 26.63 kg/m?   05/30/23 28.19 kg/m?      Smoking Social History     Tobacco Use   Smoking Status Former    Types: Cigarettes   Smokeless Tobacco Never      Lipid Profile Cholesterol   Date Value Ref Range Status   05/20/2024 254 (H) 125 - 200 Final     HDL   Date Value Ref Range Status   05/20/2024 66.0  Final     LDL   Date Value Ref Range Status   05/20/2024 160 (H) <=100 Final     Triglycerides   Date Value Ref Range Status   05/20/2024 142  Final      Blood Sugar Hemoglobin A1C   Date Value Ref Range Status   06/03/2024 6.6 (H) 4.5 - 6.5 Final     Glucose   Date Value Ref Range Status   05/22/2024 92  Final   05/20/2024 94  Final   04/03/2023 155 (H) 70 - 105 Final     Glucose, POC   Date Value Ref Range Status   06/12/2019 291 (H) 70 - 100 MG/DL Final   93/87/7979 813 (H) 70 - 100 MG/DL Final   93/88/7979 708 (H) 70 - 100 MG/DL Final        ASCVD Risk Assessment:     ASCVD 10-year risk calculated: The 10-year ASCVD risk score (Arnett DK, et al., 2019) is: 10.7%*    Values used to calculate the score:      Age: 49 years      Sex: Female      Is Non-Hispanic African American: No      Diabetic: Yes      Tobacco smoker: No      Systolic Blood Pressure: 148 mmHg      Is BP treated: Yes      HDL Cholesterol: 66 mg/dL*      Total Cholesterol: 254 mg/dL*      * - Cholesterol units were assumed for this score calculation     LDL 70-189, if ASCVD 10-y risk is >7.5%, high to moderate-intensity statin therapy is recommended  Diabetes  with ASCVD 10-y risk >7.5%, high-intensity statin therapy is recommended.  Diabetes with ASCVD 10-y risk <7.5%, moderate-intensity statin therapy is recommended.      Current Medications (including today's revisions)   amLODIPine  (NORVASC ) 5 mg tablet Take 1 tablet by mouth once daily    aspirin  EC (ASPIR-LOW) 81 mg tablet Take one tablet by mouth daily.    atorvastatin  (LIPITOR) 40 mg tablet Take one tablet by mouth daily.    clopiDOGreL (PLAVIX) 75 mg tablet Take one tablet by mouth daily.    ezetimibe  (ZETIA ) 10 mg tablet Take 1 tablet by mouth once daily    fenofibrate  nanocrystallized (TRICOR ) 145 mg tablet Take 1 tablet by mouth once daily with food    lisdexamfetamine (VYVANSE) 70 mg capsule Take one capsule by mouth daily.    metoprolol  succinate XL (TOPROL  XL) 25 mg extended release tablet Take one tablet by mouth daily.    nitroglycerin  (NITROSTAT ) 0.4 mg tablet Place 1 tab under tongue every 5 minutes as needed for Chest Pain (Not to exceed 3 doses /15 min. If pain persists , seek medical attention    nitroglycerin  (NITROSTAT ) 0.4 mg tablet DISSOLVE ONE TABLET UNDER THE TONGUE EVERY 5 MINUTES AS NEEDED FOR CHEST PAIN.  DO NOT EXCEED A TOTAL OF 3 DOSES IN 15 MINUTES    ranolazine  ER (RANEXA ) 500 mg tablet Take 1 tablet by mouth twice daily    TREMFYA 100 mg/mL pen Inject 1 mL under the skin every 90 days.         Rosamond Boettcher MD  Cardiovascular Medicine.

## 2024-08-04 NOTE — Patient Instructions
 Thank you for visiting our office today.    We would like to make the following medication adjustments:      Increase atorvastatin  to 80 mg once daily  Increase amlodipine  to 10 mg once daily       Otherwise continue the same medications as you have been doing.          We will be pursuing the following tests after your appointment today:     Have cholesterol labs checked prior to 6 month follow up        We will plan to see you back in 6 months.  Please call us  in the meantime with any questions or concerns.        Please allow 5-7 business days for our providers to review your results. All normal results will go to MyChart. If you do not have Mychart, it is strongly recommended to get this so you can easily view all your results. If you do not have mychart, we will attempt to call you once with normal lab and testing results. If we cannot reach you by phone with normal results, we will send you a letter.  If you have not heard the results of your testing after one week please give us  a call.       Your Cardiovascular Medicine [place] Team [place], [place] and [place])  phone number is [phone].

## 2024-09-06 ENCOUNTER — Encounter: Admit: 2024-09-06 | Discharge: 2024-09-06 | Payer: BLUE CROSS/BLUE SHIELD

## 2024-10-01 ENCOUNTER — Encounter: Admit: 2024-10-01 | Discharge: 2024-10-01 | Payer: BLUE CROSS/BLUE SHIELD

## 2024-10-02 ENCOUNTER — Encounter: Admit: 2024-10-02 | Discharge: 2024-10-02 | Payer: BLUE CROSS/BLUE SHIELD

## 2024-10-04 ENCOUNTER — Emergency Department: Admit: 2024-10-04 | Discharge: 2024-10-04 | Payer: BLUE CROSS/BLUE SHIELD

## 2024-10-04 ENCOUNTER — Encounter: Admit: 2024-10-04 | Discharge: 2024-10-04 | Payer: BLUE CROSS/BLUE SHIELD

## 2024-10-05 ENCOUNTER — Observation Stay: Admit: 2024-10-05 | Discharge: 2024-10-05 | Payer: BLUE CROSS/BLUE SHIELD

## 2024-10-05 ENCOUNTER — Observation Stay: Admit: 2024-10-05 | Discharge: 2024-10-06 | Payer: BLUE CROSS/BLUE SHIELD

## 2024-10-05 ENCOUNTER — Encounter: Admit: 2024-10-05 | Discharge: 2024-10-05 | Payer: BLUE CROSS/BLUE SHIELD

## 2024-10-06 ENCOUNTER — Observation Stay: Admit: 2024-10-06 | Discharge: 2024-10-06 | Payer: BLUE CROSS/BLUE SHIELD

## 2024-10-06 ENCOUNTER — Encounter: Admit: 2024-10-06 | Discharge: 2024-10-06 | Payer: BLUE CROSS/BLUE SHIELD

## 2024-10-06 NOTE — Progress Notes
 Ambulatory (External) Cardiac Monitor Enrollment Record     Placement Location: Inpatient  Vendor: iRhythm (Zio)  Mobile Cardiac Telemetry (MCOT/MCT)?: Yes  Duration of Monitor (in days): 28  Monitor Diagnosis: Chest Pain (R07.9)  Secondary Monitor Diagnosis: Cerebrovascular Accident (CVA) (I63.9)  Ordering Provider: FRANKY HOUGH I [phone]) (send to [facility])  AMB Monitor Serial Number: [id number]  No data recordedAMB Monitor Serial Number 2: [id number]  No data recorded    Start Time and Date: 10/06/24 2:40 PM   Patient Name: Traci Wade  DOB: [birth year]  MRN: [id number]  Sex: female  Mobile Phone Number: [phone] (mobile)  Home Phone Number: [phone]  Patient Address: [address] [place] [address]  Insurance Coverage: [facility] OUT OF STATE  Insurance ID: [id number]  Insurance Group #: [id number]  Insurance Subscriber: Kirwan,Diya JOANN  Implanted Cardiac Device Information: No results found for: [email or web address]      Patient instructed to contact company phone number on the monitor box with questions regarding billing, placement, troubleshooting.     Jezebel Pollet    ____________________________________________________________    Clinic Staff:    Complete additional steps for documentation double check/Co-Sign.  In Follow-up, send chart upon closing encounter to P [facility] HRM AMBULATORY MONITORS    HRM Ambulatory Monitoring Team:  Schedule on appropriate template and check-in.   Clinic Placement Schedule on clinic location [facility] schedule   Home Enrollment Schedule on Home Enrollment schedule ([facility] R[facility]M)   Given to patient in clinic for self-placement Schedule on Home Enrollment schedule ([facility] R[facility]THM)   Inpatient Schedule on [place] [facility] AMBULATORY MONITORING template   2. Please enroll with appropriate vendor.

## 2024-10-07 ENCOUNTER — Encounter: Admit: 2024-10-07 | Discharge: 2024-10-07 | Payer: BLUE CROSS/BLUE SHIELD

## 2024-10-07 NOTE — Telephone Encounter
 LVM stating she needs a return to work note, and documentation on zio patch.

## 2024-10-07 NOTE — Telephone Encounter
 Post Hospital Discharge Patient    Date of Discharge: 10/06/24    Discharging Physician: FRANKY HOUGH    Situation: Pt is needing a return to work note and documentation that states she needs to wear the Zio patch. Pt works at a [employer] and needs documentation so she can wear this at work.     Follow Up Required: No, information given to discharging PCP and requested forms be sent via mychart or email.

## 2024-10-08 ENCOUNTER — Encounter: Admit: 2024-10-08 | Discharge: 2024-10-08 | Payer: BLUE CROSS/BLUE SHIELD

## 2024-11-02 ENCOUNTER — Encounter: Admit: 2024-11-02 | Discharge: 2024-11-02 | Payer: BLUE CROSS/BLUE SHIELD

## 2024-11-04 ENCOUNTER — Encounter: Admit: 2024-11-04 | Discharge: 2024-11-04 | Payer: BLUE CROSS/BLUE SHIELD

## 2024-11-04 MED ORDER — EZETIMIBE 10 MG PO TAB
10 mg | ORAL_TABLET | Freq: Every day | ORAL | 3 refills | 90.00000 days | Status: AC
Start: 2024-11-04 — End: ?

## 2024-11-05 ENCOUNTER — Ambulatory Visit: Admit: 2024-11-05 | Discharge: 2024-11-06 | Payer: BLUE CROSS/BLUE SHIELD

## 2024-11-05 ENCOUNTER — Encounter: Admit: 2024-11-05 | Discharge: 2024-11-05 | Payer: BLUE CROSS/BLUE SHIELD

## 2024-11-05 VITALS — BP 144/77 | HR 78 | Ht 66.0 in | Wt 161.0 lb

## 2024-11-05 DIAGNOSIS — E113299 Type 2 diabetes mellitus with mild nonproliferative diabetic retinopathy without macular edema, unspecified eye: Secondary | ICD-10-CM

## 2024-11-05 DIAGNOSIS — Z8673 Personal history of transient ischemic attack (TIA), and cerebral infarction without residual deficits: Principal | ICD-10-CM

## 2024-11-05 DIAGNOSIS — E785 Hyperlipidemia, unspecified: Secondary | ICD-10-CM

## 2024-11-05 DIAGNOSIS — I25118 Atherosclerotic heart disease of native coronary artery with other forms of angina pectoris: Secondary | ICD-10-CM

## 2024-11-05 DIAGNOSIS — R0789 Other chest pain: Secondary | ICD-10-CM

## 2024-11-05 DIAGNOSIS — Q283 Other malformations of cerebral vessels: Secondary | ICD-10-CM

## 2024-11-05 DIAGNOSIS — G4489 Other headache syndrome: Secondary | ICD-10-CM

## 2024-11-05 DIAGNOSIS — I739 Peripheral vascular disease, unspecified: Secondary | ICD-10-CM

## 2024-11-05 DIAGNOSIS — E782 Mixed hyperlipidemia: Secondary | ICD-10-CM

## 2024-11-05 MED ORDER — RIMEGEPANT 75 MG PO TBDI
75 mg | ORAL_TABLET | Freq: Every day | ORAL | 3 refills | 30.00000 days | Status: AC | PRN
Start: 2024-11-05 — End: ?

## 2024-11-05 MED ORDER — TICAGRELOR 90 MG PO TAB
90 mg | ORAL_TABLET | Freq: Two times a day (BID) | ORAL | 3 refills | 30.00000 days | Status: AC
Start: 2024-11-05 — End: ?

## 2024-11-05 MED ORDER — AMITRIPTYLINE 10 MG PO TAB
20 mg | ORAL_TABLET | Freq: Every evening | ORAL | 5 refills | 30.00000 days | Status: AC
Start: 2024-11-05 — End: ?

## 2024-11-05 NOTE — Progress Notes [1]
 Date of Service: 11/05/2024    Subjective:             Traci Wade is a 59 y.o. female.    History of Present Illness      Patient is a 59 year old female with history of CAD status post PCI hypertension hyperlipidemia ADHD longtime smoker but quit.  Initially back in May she presented to an outside hospital for right-sided numbness and weakness.  She was transferred to [facility] and received IV tenecteplase.  And then went to the hospital.  She had a CT angiogram of the head and neck that showed a left P2 stenosis and possible hypodensity in the left thalamus.  Notably at the time she did fairly elevated blood pressure as well.  She is also having fairly severe headache at the time.  In the hospital she received a migraine cocktail.  Her NIH was 0.  She did have an MRI scan that was negative for acute stroke but did show some chronic changes.  She had echocardiogram done that showed a normal EF with no ASD.  But did have a moderately dilated right atrium.  She is also noted to have a elevated LDL at 130.  She was started on atorvastatin .  She was noted to have an A1c at the time of 6.3%.  She says since then its gone up considerably and she has been restarted on her insulin  regimen at home.  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  But otherwise unremarkable.  She also had an MRI back in November 2024.  This was read as having diffuse cortical atrophy with proportional enlargement of the ventricular system.  Extensive increased T2 signal within the supratentorial white matter.  There is also a focus of fluid signal involving the right genu of the corpus callosum.  There is also new T2 signal evident within the left thalamus with enhancement.  There was read as prominent venous enhancement in the right subinsular region with draining vein to the right lateral ventricle.  Again suggestive of a DVA.    She recently was admitted to [facility] hospital after she presented with sudden severe headache.  She had a CT scan done of her head at the time as well as a CT venogram.  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  She was admitted for 2 days.  She had a migraine cocktail which seemed to help as well as caffeine and Tylenol  as well.  She has been stable to be discharged home in good care.  She did have a 30-day cardiac event monitor ordered the results of which are not returned yet.    She says she does have frequent headaches at least every other day that can be pretty severe.  She is able to work and get things done but on most part she is pretty debilitated by them at times.  She has never tried anything for her headaches other than Tylenol  and ibuprofen in the past.    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)     Surgical History:   Procedure Laterality Date    ANGIOGRAPHY CORONARY ARTERY WITH LEFT HEART CATHETERIZATION N/A 06/12/2018    Performed by Freeman Donnice NOVAK, MD at [facility] CATH LAB    POSSIBLE PERCUTANEOUS CORONARY STENT PLACEMENT WITH ANGIOPLASTY N/A 06/12/2018    Performed by Freeman Donnice NOVAK, MD at [facility] CATH LAB    ANGIOGRAPHY CORONARY ARTERY WITH LEFT HEART CATHETERIZATION N/A 04/11/2023    Performed by Grayson Oneil LABOR, MD at [facility] CATH LAB    PERCUTANEOUS CORONARY STENT PLACEMENT WITH ANGIOPLASTY N/A 04/11/2023    Performed by Grayson Oneil LABOR, MD at [facility] CATH LAB     Social History     Tobacco Use    Smoking status: Former     Current packs/day: 0.00     Types: Cigarettes     Quit date: 2000     Years since quitting: 25.8    Smokeless tobacco: Never   Vaping Use    Vaping status: Never Used   Substance Use Topics Alcohol use: Not Currently    Drug use: Never      family history includes Cancer in her mother and sister; Heart Attack in her father; Premature Heart Disease in her sister; Stroke in her maternal grandmother.   Allergies[1]                 Objective:         acetaminophen  (TYLENOL  EXTRA STRENGTH) 500 mg tablet Take one tablet by mouth every 6 hours as needed. Max of 4,000 mg of acetaminophen  in 24 hours.  Indications: fever, pain    amitriptyline (ELAVIL) 10 mg tablet Take two tablets by mouth at bedtime daily.    amLODIPine  (NORVASC ) 10 mg tablet Take one tablet by mouth daily.    aspirin  EC (ASPIR-LOW) 81 mg tablet Take one tablet by mouth daily.    atorvastatin  (LIPITOR) 80 mg tablet Take one tablet by mouth daily.    clopiDOGreL (PLAVIX) 75 mg tablet Take one tablet by mouth daily.    ezetimibe  (ZETIA ) 10 mg tablet Take 1 tablet by mouth once daily    fenofibrate  nanocrystallized (TRICOR ) 145 mg tablet Take 1 tablet by mouth once daily with food    insulin  glargine (LANTUS  SOLOSTAR U-100 INSULIN ) 100 unit/mL (3 mL) subcutaneous PEN Inject twenty Units under the skin daily.    lisdexamfetamine (VYVANSE) 70 mg capsule Take one capsule by mouth daily.    metoprolol  succinate XL (TOPROL  XL) 25 mg extended release tablet Take one tablet by mouth daily.    MOUNJARO 5 mg/0.5 mL injector PEN inject 0.5ml SUBCUTANEOUSLY EVERY WEEK    nitroglycerin  (NITROSTAT ) 0.4 mg tablet Place 1 tab under tongue every 5 minutes as needed for Chest Pain (Not to exceed 3 doses /15 min. If pain persists , seek medical attention    pantoprazole DR (PROTONIX) 40 mg tablet Take one tablet by mouth daily. Indications: indigestion    ranolazine  ER (RANEXA ) 500 mg tablet Take 1 tablet by mouth twice daily    rimegepant (NURTEC ODT) 75 mg rapid dissolve tablet Dissolve one tablet by mouth daily as needed for Migraine symptoms. Place on or under the tongue. Max 75 mg in 24 hours, and 18 doses in 30 days.  Indications: a migraine headache ticagrelor  (BRILINTA ) 90 mg tablet Take one tablet by mouth twice daily.    TREMFYA 100 mg/mL pen Inject 1 mL under the skin every 90 days.  Vitals:    11/05/24 0839   BP: (!) 144/77   Pulse: 78   PainSc: Six   Weight: 73 kg (161 lb)   Height: 167.6 cm (5' 6)     Body mass index is 25.99 kg/m?SABRA     Physical Exam           General appearance  alert, cooperative, no distress, appears stated age   Head  Normocephalic, without obvious abnormality, atraumatic   Eyes  conjunctivae/corneas clear. PERRL, EOM's intact. Fundi benign   Neck supple, symmetrical, trachea midline, no adenopathy, thyroid: not enlarged, symmetric, no tenderness/mass/nodules, no carotid bruit and no JVD   Lungs   clear to auscultation bilaterally   Heart  regular rate and rhythm, S1, S2 normal, no murmur, click, rub or gallop   Abdomen   soft, non-tender. Bowel sounds normal. No masses,  No organomegaly   Extremities extremities normal, atraumatic, no cyanosis or edema   Pulses 2+ and symmetric   Skin Skin color, texture, turgor normal. No rashes or lesions   Lymph nodes Cervical, supraclavicular, and axillary nodes normal.           Mental Status: Alert and oriented Speech is fluent. MS at baseline  Cranial Nerves: PERRLA, EOMFI, No facial droop  Strength: No pronator drift Power is 5/5 in upper and lower extremities and is symmetric  Sensation: Intact to light touch and pinprick  Reflexes: 2+ throughout  Coordination: Intact FNF, HTS and RAM  Gait: Normal gait, intact tandem, heel and toe walking    Assessment and Plan:    59 year old female with right-sided weakness status post tenecteplase.  MRI that was without acute stroke and likely chronic daily headaches with intermittent migraines.    Plan  Will try to switch her clopidogrel to Brilinta  given she is had symptoms on Plavix and aspirin  in the past.  She may be a Plavix nonresponder.  She is unable to get to the lab to check the lab test though.  I discussed with her that the Brilinta  may be expensive if it is not covered by insurance.  If they do cover it she should stop the Plavix and start Brilinta .  If they do not cover it she should continue on the Plavix.  She was understanding  For her headaches would like to start amitriptyline nightly.  Did discuss with her that this can make her drowsy.  Try to use it as to help her sleep.  She says she does not get great sleep so this may be beneficial to her.  For headache abortion she would like to try Nurtec as needed.  She is not a good triptan candidate due to her cerebrovascular and cardiovascular disease.  She would likely benefit from a sleep study  For the DVA that she has unable to see the MRI images to see if it is stable but from the report it certainly seems to be.  I think a repeat MRI though some time next year like after her next appointment would likely be helpful.    Will follow her up in 3 months to see how she is doing.  And also follow-up with the results of the cardiac event monitor                      Total time spent on today's office visit was 60 minutes. This includes face-to-face in person visit with patient as well as nonface-to-face time including review of the EMR, outside  records, labs, radiologic studies, electrophysiology and electroencephalogram, formulation of treatment plan, after visit summary, future disposition, personal discussions with patient and family, and lastly on documentation.         [1]   Allergies  Allergen Reactions    Dilaudid [Hydromorphone] SEE COMMENTS     Patient reports an allergy to Dilaudid.  She reported it shuts breathing down.    Morphine ANAPHYLAXIS    Celebrex [Celecoxib] RASH    Contact Metal Agent RASH and SEE COMMENTS     staples    Latex RASH    Penicillins RASH    Sulfa (Sulfonamide Antibiotics) HIVES    Mushroom VOMITING

## 2024-11-05 NOTE — Patient Instructions [37]
 Check on cost of Brilinta  - if insurance covers it then start taking and stop taking the Plavix    Start taking the amitriptyline for headache prevention. Take two tabs at night    The Nurtec is to take as needed when you have a headache.

## 2024-11-06 DIAGNOSIS — R079 Chest pain, unspecified: Secondary | ICD-10-CM

## 2024-11-06 DIAGNOSIS — R519 Headache, unspecified: Secondary | ICD-10-CM

## 2024-11-06 DIAGNOSIS — I6529 Occlusion and stenosis of unspecified carotid artery: Secondary | ICD-10-CM

## 2024-11-09 ENCOUNTER — Encounter: Admit: 2024-11-09 | Discharge: 2024-11-09 | Payer: BLUE CROSS/BLUE SHIELD

## 2024-11-09 NOTE — Progress Notes [1]
PA for  Nurtec 75 mg sent via Cover My Meds. Pending response.

## 2024-11-10 ENCOUNTER — Encounter: Admit: 2024-11-10 | Discharge: 2024-11-10 | Payer: BLUE CROSS/BLUE SHIELD

## 2024-11-10 NOTE — Progress Notes [1]
 Prior Authorization Approved for the following : Nurtec 75 mg

## 2024-11-18 ENCOUNTER — Encounter: Admit: 2024-11-18 | Discharge: 2024-11-18 | Payer: BLUE CROSS/BLUE SHIELD

## 2024-11-18 NOTE — Telephone Encounter [36]
 Traci Wade called into the nursing line to inform us  that she is scheduled for a rotator cuff repair at [place] with Dr. Maranda on 12/18.  She states that she is needing cardiac clearance for this procedure.      Traci Wade is also on aspirin  81mg  and Brilinta  90 mcg twice daily.  She states she is unsure, but assumes that they will request to hold those medications prior to the procedure.    Traci Wade was most recently seen in clinic by Dr. Rosamond on August 04, 2024.  She was recently admitted at [facility] for observation of CP/SOB on 10/04/2024.  During that time, Traci Wade was evaluated with an echocardiogram and stress test.   Echocardiogram 10/06/2024:  Normal left ventricular size and systolic function, Biplane LVEF 62%.  No segmental wall motion abnormalities. Normal diastolic function.  Normal right ventricular size and function.  Normal size atria.  No hemodynamically significant valvular abnormality on 2D and Doppler images.  Normal central venous pressure and pulmonary artery pressure.  No pericardial effusion.  Regadenoson  MPI 10/05/2024:   This study is probably normal with no evidence of significant myocardial ischemia.  There was probable soft tissue attenuation present, but no definitive perfusion abnormalities.  There was normal regional wall motion and thickening in all segments.  Left ventricular systolic function is normal. There are no high risk prognostic indicators present.  The pharmacologic ECG portion of the study is negative for ischemia.     Comparison was made to a prior study, performed on the ADAC camera, in June 2022.  Prior study demonstrated a calculated LVEF of 53% with an EDV of 69 mL.  Comparing the 2 studies qualitatively, the prior study showed a small sized, moderate intensity, predominantly fixed defect in the apical lateral segment which is not clearly evident on the current study.     In aggregate the current study is low risk in regards to predicted annual cardiovascular mortality rate.    Patient states that she is feeling fine at this time.  She denies any cardiac symptoms - denies CP, SOB or palpitations.  No new edema.  She is able to complete her daily activities without needing to stop and catch her breath.    Will route to Dr. Rosamond for review and recommendations.

## 2024-12-10 ENCOUNTER — Encounter: Admit: 2024-12-10 | Discharge: 2024-12-10 | Payer: BLUE CROSS/BLUE SHIELD

## 2024-12-17 ENCOUNTER — Encounter: Admit: 2024-12-17 | Discharge: 2024-12-17 | Payer: BLUE CROSS/BLUE SHIELD

## 2025-01-08 ENCOUNTER — Encounter: Admit: 2025-01-08 | Discharge: 2025-01-08 | Payer: BLUE CROSS/BLUE SHIELD

## 2025-01-12 ENCOUNTER — Encounter: Admit: 2025-01-12 | Discharge: 2025-01-12 | Payer: BLUE CROSS/BLUE SHIELD

## 2025-01-13 ENCOUNTER — Encounter: Admit: 2025-01-13 | Discharge: 2025-01-13 | Payer: BLUE CROSS/BLUE SHIELD
